# Patient Record
Sex: Female | Born: 1975 | Race: White | Hispanic: No | Smoking: Former smoker
Health system: Southern US, Community
[De-identification: ages and names within clinical notes are randomized; demographics above are authoritative.]

## PROBLEM LIST (undated history)

## (undated) ENCOUNTER — Ambulatory Visit: Payer: 59 | Source: Home / Self Care

## (undated) DIAGNOSIS — R9431 Abnormal electrocardiogram [ECG] [EKG]: Secondary | ICD-10-CM

## (undated) DIAGNOSIS — F419 Anxiety disorder, unspecified: Secondary | ICD-10-CM

## (undated) DIAGNOSIS — D649 Anemia, unspecified: Secondary | ICD-10-CM

## (undated) DIAGNOSIS — T7840XA Allergy, unspecified, initial encounter: Secondary | ICD-10-CM

## (undated) DIAGNOSIS — R0683 Snoring: Secondary | ICD-10-CM

## (undated) DIAGNOSIS — G2581 Restless legs syndrome: Secondary | ICD-10-CM

## (undated) DIAGNOSIS — K219 Gastro-esophageal reflux disease without esophagitis: Secondary | ICD-10-CM

## (undated) DIAGNOSIS — F329 Major depressive disorder, single episode, unspecified: Secondary | ICD-10-CM

## (undated) DIAGNOSIS — F32A Depression, unspecified: Secondary | ICD-10-CM

## (undated) DIAGNOSIS — R51 Headache: Secondary | ICD-10-CM

## (undated) DIAGNOSIS — IMO0002 Reserved for concepts with insufficient information to code with codable children: Secondary | ICD-10-CM

## (undated) DIAGNOSIS — R519 Headache, unspecified: Secondary | ICD-10-CM

## (undated) HISTORY — DX: Depression, unspecified: F32.A

## (undated) HISTORY — DX: Restless legs syndrome: G25.81

## (undated) HISTORY — DX: Abnormal electrocardiogram (ECG) (EKG): R94.31

## (undated) HISTORY — DX: Anxiety disorder, unspecified: F41.9

## (undated) HISTORY — DX: Reserved for concepts with insufficient information to code with codable children: IMO0002

## (undated) HISTORY — DX: Major depressive disorder, single episode, unspecified: F32.9

## (undated) HISTORY — DX: Allergy, unspecified, initial encounter: T78.40XA

---

## 1995-01-18 HISTORY — PX: FOOT SURGERY: SHX648

## 1999-06-29 ENCOUNTER — Inpatient Hospital Stay (HOSPITAL_COMMUNITY): Admission: AD | Admit: 1999-06-29 | Discharge: 1999-06-29 | Payer: Self-pay | Admitting: *Deleted

## 2000-02-25 ENCOUNTER — Other Ambulatory Visit: Admission: RE | Admit: 2000-02-25 | Discharge: 2000-02-25 | Payer: Self-pay | Admitting: Gynecology

## 2001-03-02 ENCOUNTER — Other Ambulatory Visit: Admission: RE | Admit: 2001-03-02 | Discharge: 2001-03-02 | Payer: Self-pay | Admitting: Gynecology

## 2002-03-08 ENCOUNTER — Other Ambulatory Visit: Admission: RE | Admit: 2002-03-08 | Discharge: 2002-03-08 | Payer: Self-pay | Admitting: Gynecology

## 2002-09-13 ENCOUNTER — Inpatient Hospital Stay (HOSPITAL_COMMUNITY): Admission: AD | Admit: 2002-09-13 | Discharge: 2002-09-13 | Payer: Self-pay | Admitting: Gynecology

## 2002-09-24 ENCOUNTER — Other Ambulatory Visit: Admission: RE | Admit: 2002-09-24 | Discharge: 2002-09-24 | Payer: Self-pay | Admitting: Gynecology

## 2003-04-10 ENCOUNTER — Inpatient Hospital Stay (HOSPITAL_COMMUNITY): Admission: AD | Admit: 2003-04-10 | Discharge: 2003-04-12 | Payer: Self-pay | Admitting: Gynecology

## 2003-06-04 ENCOUNTER — Other Ambulatory Visit: Admission: RE | Admit: 2003-06-04 | Discharge: 2003-06-04 | Payer: Self-pay | Admitting: Gynecology

## 2004-01-28 ENCOUNTER — Ambulatory Visit: Payer: Self-pay | Admitting: Family Medicine

## 2004-02-27 ENCOUNTER — Ambulatory Visit: Payer: Self-pay | Admitting: Family Medicine

## 2004-08-27 ENCOUNTER — Other Ambulatory Visit: Admission: RE | Admit: 2004-08-27 | Discharge: 2004-08-27 | Payer: Self-pay | Admitting: Gynecology

## 2004-12-20 ENCOUNTER — Encounter: Admission: RE | Admit: 2004-12-20 | Discharge: 2004-12-20 | Payer: Self-pay | Admitting: Gynecology

## 2005-03-28 ENCOUNTER — Ambulatory Visit: Payer: Self-pay | Admitting: Family Medicine

## 2005-04-20 ENCOUNTER — Ambulatory Visit: Payer: Self-pay | Admitting: Family Medicine

## 2005-05-27 ENCOUNTER — Ambulatory Visit: Payer: Self-pay | Admitting: Family Medicine

## 2005-06-10 ENCOUNTER — Ambulatory Visit: Payer: Self-pay | Admitting: Family Medicine

## 2005-07-01 ENCOUNTER — Ambulatory Visit: Payer: Self-pay | Admitting: Family Medicine

## 2005-08-02 ENCOUNTER — Ambulatory Visit: Payer: Self-pay | Admitting: Family Medicine

## 2005-11-04 ENCOUNTER — Ambulatory Visit: Payer: Self-pay | Admitting: Family Medicine

## 2005-11-18 ENCOUNTER — Other Ambulatory Visit: Admission: RE | Admit: 2005-11-18 | Discharge: 2005-11-18 | Payer: Self-pay | Admitting: Gynecology

## 2006-04-20 ENCOUNTER — Encounter: Payer: Self-pay | Admitting: Internal Medicine

## 2006-04-20 DIAGNOSIS — E669 Obesity, unspecified: Secondary | ICD-10-CM

## 2006-04-20 DIAGNOSIS — F17200 Nicotine dependence, unspecified, uncomplicated: Secondary | ICD-10-CM | POA: Insufficient documentation

## 2006-06-06 ENCOUNTER — Other Ambulatory Visit: Admission: RE | Admit: 2006-06-06 | Discharge: 2006-06-06 | Payer: Self-pay | Admitting: Gynecology

## 2006-11-28 ENCOUNTER — Other Ambulatory Visit: Admission: RE | Admit: 2006-11-28 | Discharge: 2006-11-28 | Payer: Self-pay | Admitting: Gynecology

## 2007-02-27 ENCOUNTER — Other Ambulatory Visit: Admission: RE | Admit: 2007-02-27 | Discharge: 2007-02-27 | Payer: Self-pay | Admitting: Gynecology

## 2007-09-06 ENCOUNTER — Other Ambulatory Visit: Admission: RE | Admit: 2007-09-06 | Discharge: 2007-09-06 | Payer: Self-pay | Admitting: Gynecology

## 2008-02-28 ENCOUNTER — Ambulatory Visit: Payer: Self-pay | Admitting: Gynecology

## 2008-02-28 ENCOUNTER — Other Ambulatory Visit: Admission: RE | Admit: 2008-02-28 | Discharge: 2008-02-28 | Payer: Self-pay | Admitting: Gynecology

## 2008-02-28 ENCOUNTER — Encounter: Payer: Self-pay | Admitting: Gynecology

## 2008-04-17 HISTORY — PX: LAPAROSCOPIC GASTRIC BANDING: SHX1100

## 2008-05-01 ENCOUNTER — Ambulatory Visit: Payer: Self-pay | Admitting: Gynecology

## 2008-09-08 ENCOUNTER — Ambulatory Visit: Payer: Self-pay | Admitting: Gynecology

## 2008-09-08 ENCOUNTER — Other Ambulatory Visit: Admission: RE | Admit: 2008-09-08 | Discharge: 2008-09-08 | Payer: Self-pay | Admitting: Gynecology

## 2008-09-08 ENCOUNTER — Encounter: Payer: Self-pay | Admitting: Gynecology

## 2008-09-25 ENCOUNTER — Ambulatory Visit: Payer: Self-pay | Admitting: Gynecology

## 2009-03-04 ENCOUNTER — Other Ambulatory Visit: Admission: RE | Admit: 2009-03-04 | Discharge: 2009-03-04 | Payer: Self-pay | Admitting: Gynecology

## 2009-03-04 ENCOUNTER — Ambulatory Visit: Payer: Self-pay | Admitting: Gynecology

## 2009-09-10 ENCOUNTER — Other Ambulatory Visit: Admission: RE | Admit: 2009-09-10 | Discharge: 2009-09-10 | Payer: Self-pay | Admitting: Gynecology

## 2009-09-10 ENCOUNTER — Ambulatory Visit: Payer: Self-pay | Admitting: Gynecology

## 2010-06-04 NOTE — Discharge Summary (Signed)
NAMEREGINA, GANCI                         ACCOUNT NO.:  [id number]   MEDICAL RECORD NO.:  [id number]                   PATIENT TYPE:  INP   LOCATION:  9105                                 FACILITY:  [facility]   PHYSICIAN:  Timothy P. Fontaine, M.D.           DATE OF BIRTH:  [birth year]   DATE OF ADMISSION:  04/10/2003  DATE OF DISCHARGE:  04/12/2003                                 DISCHARGE SUMMARY   DISCHARGE DIAGNOSES:  1. Intrauterine pregnancy at 39 weeks delivered.  2. Status post precipitous vaginal delivery.   HISTORY:  This is a 28-years-of-age female gravida 2 para 0 with an EDC of  April 19, 2003.  Prenatal course had been uncomplicated.   HOSPITAL COURSE:  On April 10, 2003 the patient presented at 39 weeks with  rupture of membranes at 4:30 a.m.  She was noted to be a thin meconium and  secondary to premature rupture of membranes the patient was begun on IV  antibiotics.  Subsequently, on April 11, 2003 the patient had a precipitous  delivery per nursing of a female, Apgars of 8 and 9, weight of 5 pounds 8  ounces.  There was no episiotomy or laceration; there were no complications.  Postpartum the patient remained afebrile, voiding, in stable condition.  She  was discharged to home on April 12, 2003 and given [facility]  postpartum instructions.   ACCESSORY CLINICAL FINDINGS/LABORATORY DATA:  The patient is A positive,  rubella immune.  Hemoglobin on April 11, 2003 was 11.5.   DISPOSITION:  The patient is discharged to home, informed to return to the  office in 6 weeks, if had any problem prior to that time to be seen in the  office, given a prescription for Tylox p.r.n. pain.     Susa Loffler, P.A.                    Timothy P. Fontaine, M.D.    TSG/MEDQ  D:  04/28/2003  T:  04/28/2003  Job:  [id number]

## 2011-01-02 ENCOUNTER — Ambulatory Visit: Payer: BC Managed Care – PPO

## 2011-01-02 DIAGNOSIS — H66009 Acute suppurative otitis media without spontaneous rupture of ear drum, unspecified ear: Secondary | ICD-10-CM

## 2011-01-13 DIAGNOSIS — F329 Major depressive disorder, single episode, unspecified: Secondary | ICD-10-CM | POA: Insufficient documentation

## 2011-01-13 DIAGNOSIS — IMO0001 Reserved for inherently not codable concepts without codable children: Secondary | ICD-10-CM | POA: Insufficient documentation

## 2011-01-13 DIAGNOSIS — F419 Anxiety disorder, unspecified: Secondary | ICD-10-CM | POA: Insufficient documentation

## 2011-01-13 DIAGNOSIS — F32A Depression, unspecified: Secondary | ICD-10-CM | POA: Insufficient documentation

## 2011-01-24 ENCOUNTER — Ambulatory Visit (INDEPENDENT_AMBULATORY_CARE_PROVIDER_SITE_OTHER): Payer: BC Managed Care – PPO | Admitting: Gynecology

## 2011-01-24 ENCOUNTER — Encounter: Payer: Self-pay | Admitting: Gynecology

## 2011-01-24 ENCOUNTER — Other Ambulatory Visit: Payer: Self-pay | Admitting: Gynecology

## 2011-01-24 ENCOUNTER — Other Ambulatory Visit (HOSPITAL_COMMUNITY)
Admission: RE | Admit: 2011-01-24 | Discharge: 2011-01-24 | Disposition: A | Discharge status: Home / Self Care | Payer: BC Managed Care – PPO | Source: Ambulatory Visit | Attending: Gynecology | Admitting: Gynecology

## 2011-01-24 VITALS — BP 120/76 | Ht 62.0 in | Wt 200.0 lb

## 2011-01-24 DIAGNOSIS — N390 Urinary tract infection, site not specified: Secondary | ICD-10-CM

## 2011-01-24 DIAGNOSIS — Z131 Encounter for screening for diabetes mellitus: Secondary | ICD-10-CM

## 2011-01-24 DIAGNOSIS — Z01419 Encounter for gynecological examination (general) (routine) without abnormal findings: Secondary | ICD-10-CM

## 2011-01-24 DIAGNOSIS — Z13220 Encounter for screening for lipoid disorders: Secondary | ICD-10-CM

## 2011-01-24 DIAGNOSIS — Z30431 Encounter for routine checking of intrauterine contraceptive device: Secondary | ICD-10-CM

## 2011-01-24 LAB — LIPID PANEL
LDL Cholesterol: 75 mg/dL (ref 0–99)
Triglycerides: 141 mg/dL (ref <150)
VLDL: 28 mg/dL (ref 0–40)

## 2011-01-24 LAB — URINALYSIS, ROUTINE W REFLEX MICROSCOPIC
Ketones, ur: NEGATIVE mg/dL
Nitrite: NEGATIVE
Specific Gravity, Urine: 1.015 (ref 1.005–1.030)
Urobilinogen, UA: 0.2 mg/dL (ref 0.0–1.0)

## 2011-01-24 LAB — URINALYSIS, MICROSCOPIC ONLY: Crystals: NONE SEEN

## 2011-01-24 NOTE — Patient Instructions (Signed)
Attempt to stop smoking as we discussed. Follow up for Pap smear results. Assuming normal then repeat Pap smear in one year. If abnormal then we'll discuss.

## 2011-01-24 NOTE — Progress Notes (Signed)
Daisy Hoffman March 26, 1975 [id number]        36 y.o.  for annual exam.  Mirena IUD, doing well with regular menses.  Past medical history,surgical history, medications, allergies, family history and social history were all reviewed and documented in the EPIC chart. ROS:  Was performed and pertinent positives and negatives are included in the history.  Exam: chaperone present Filed Vitals:   01/24/11 0911  BP: 120/76   General appearance  Normal Skin grossly normal Head/Neck normal with no cervical or supraclavicular adenopathy thyroid normal Lungs  clear Cardiac RR, without RMG Abdominal  soft, nontender, without masses, organomegaly or hernia Breasts  examined lying and sitting without masses, retractions, discharge or axillary adenopathy. Pelvic  Ext/BUS/vagina  normal   Cervix  normal  Pap done IUD string visualized  Uterus  anteverted, normal size, shape and contour, midline and mobile nontender   Adnexa  Without masses or tenderness    Anus and perineum  normal   Rectovaginal  normal sphincter tone without palpated masses or tenderness.    Assessment/Plan:  36 y.o. female for annual exam.    1. 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 Follow up colposcopy 2008 showed cervicitis but no dysplasia. Her last Pap smear showing low-grade dysplasia was 2010.   her Pap smears January 2011 and August 2011 was negative. Pap smear was done today. If negative will continue annual cytology time several more negatives and then we'll go to less frequent screening interval. 2. IUD. Patient has Mirena IUD placed April 2010. IUD string was visualized and she's doing well with this. She will continue through April 2015 at which point she will have it replaced. 3. Mammography. Patient has never had her baseline mammogram. I reviewed screening recommendations between 35 and 40 and she will decide  when to do this. She has no strong family history and is going to wait closer to 40. SBE monthly was reviewed. 4. Cigarette smoking. I reviewed stop smoking strategies. She has been discussing this with Dr. Everlene Other and will follow up with him in reference to this. 5. Anxiety. She is on Zoloft per Dr. Everlene Other and will continue on this. 6. Health maintenance. Will check baseline CBC, glucose, lipid profile, urinalysis. She'll continue to see Dr. Everlene Other for routine health maintenance in see me in a year assuming that her Pap smear is normal.   Dara Lords MD, 9:41 AM 01/24/2011

## 2011-01-24 NOTE — Progress Notes (Signed)
Addended by: Rushie Goltz on: 01/24/2011 10:15 AM   Modules accepted: Orders

## 2011-01-24 NOTE — Progress Notes (Signed)
Addended by: Dara Lords on: 01/24/2011 10:12 AM   Modules accepted: Orders

## 2011-01-25 ENCOUNTER — Other Ambulatory Visit: Payer: Self-pay

## 2011-01-25 LAB — CBC WITH DIFFERENTIAL/PLATELET
Basophils Absolute: 0 10*3/uL (ref 0.0–0.1)
Eosinophils Absolute: 0.2 10*3/uL (ref 0.0–0.7)
Eosinophils Relative: 2 % (ref 0–5)
HCT: 43.5 % (ref 36.0–46.0)
Lymphocytes Relative: 23 % (ref 12–46)
MCH: 29 pg (ref 26.0–34.0)
MCHC: 32.6 g/dL (ref 30.0–36.0)
MCV: 88.8 fL (ref 78.0–100.0)
Monocytes Absolute: 0.4 10*3/uL (ref 0.1–1.0)
RDW: 13 % (ref 11.5–15.5)
WBC: 8.4 10*3/uL (ref 4.0–10.5)

## 2011-01-25 NOTE — Progress Notes (Signed)
Addended by: Dara Lords on: 01/25/2011 07:40 AM   Modules accepted: Orders

## 2011-01-26 LAB — URINE CULTURE: Colony Count: NO GROWTH

## 2011-09-06 ENCOUNTER — Ambulatory Visit: Payer: BC Managed Care – PPO | Admitting: Internal Medicine

## 2011-09-06 VITALS — BP 112/71 | HR 100 | Temp 98.7°F | Resp 18 | Ht 63.0 in | Wt 212.0 lb

## 2011-09-06 DIAGNOSIS — S139XXA Sprain of joints and ligaments of unspecified parts of neck, initial encounter: Secondary | ICD-10-CM

## 2011-09-06 DIAGNOSIS — S161XXA Strain of muscle, fascia and tendon at neck level, initial encounter: Secondary | ICD-10-CM

## 2011-09-06 DIAGNOSIS — M542 Cervicalgia: Secondary | ICD-10-CM

## 2011-09-06 MED ORDER — HYDROCODONE-ACETAMINOPHEN 5-500 MG PO TABS: 1.0000 | ORAL_TABLET | Freq: Three times a day (TID) | ORAL | Status: AC | PRN

## 2011-09-06 MED ORDER — METHOCARBAMOL 750 MG PO TABS: 750.0000 mg | ORAL_TABLET | Freq: Four times a day (QID) | ORAL | Status: AC

## 2011-09-06 NOTE — Progress Notes (Signed)
  Subjective:    Patient ID: Daisy Hoffman, female    DOB: October 10, 1975, 36 y.o.   MRN: [id number]  HPI Felt a pop in her neck, now painful and in spasm. No NMS loss   Review of Systems     Objective:   Physical Exam Tender left neck muscles Neuro intact       Assessment & Plan:  Neck care manual

## 2012-03-26 ENCOUNTER — Encounter: Payer: BC Managed Care – PPO | Admitting: Gynecology

## 2012-04-09 ENCOUNTER — Ambulatory Visit: Payer: BC Managed Care – PPO | Admitting: Emergency Medicine

## 2012-04-09 ENCOUNTER — Ambulatory Visit: Payer: BC Managed Care – PPO

## 2012-04-09 ENCOUNTER — Telehealth: Payer: Self-pay

## 2012-04-09 VITALS — BP 124/82 | HR 117 | Temp 98.4°F | Resp 17 | Ht 63.0 in | Wt 222.0 lb

## 2012-04-09 DIAGNOSIS — M25579 Pain in unspecified ankle and joints of unspecified foot: Secondary | ICD-10-CM

## 2012-04-09 DIAGNOSIS — M25572 Pain in left ankle and joints of left foot: Secondary | ICD-10-CM

## 2012-04-09 DIAGNOSIS — T148XXA Other injury of unspecified body region, initial encounter: Secondary | ICD-10-CM

## 2012-04-09 DIAGNOSIS — IMO0002 Reserved for concepts with insufficient information to code with codable children: Secondary | ICD-10-CM

## 2012-04-09 MED ORDER — NAPROXEN SODIUM 550 MG PO TABS: 550.0000 mg | ORAL_TABLET | Freq: Two times a day (BID) | ORAL | Status: DC

## 2012-04-09 NOTE — Progress Notes (Addendum)
[facility] [address], [place] [address] [phone]  Date:  04/09/2012   Name:  Daisy Hoffman   DOB:  May 02, 1975   MRN:  [id number]  PCP:  Aura Dials, MD    Chief Complaint: Foot Pain   History of Present Illness:  Daisy Hoffman is a 37 y.o. very pleasant female patient who presents with the following:  Larey Seat off a playground ride and injured her left fore foot medially at the great to MTP joint.  Denies any other injury.  No improvement with over the counter medications or other home remedies.   Patient Active Problem List  Diagnosis  . OBESITY  . TOBACCO ABUSE  . Depression  . Anxiety  . IUD  . LGSIL (low grade squamous intraepithelial dysplasia)    Past Medical History  Diagnosis Date  . Depression   . Anxiety   . IUD     Inserted 05-01-08  . 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 Allergy     Past Surgical History  Procedure Laterality Date  . Foot surgery  1997  . 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 Mirena      Inserted 05-01-08    History  Substance Use Topics  . Smoking status: Former Smoker -- 1.00 packs/day    Types: Cigarettes    Start date: 01/25/2012  . Smokeless tobacco: Not on file  . Alcohol Use: No    Family History  Problem Relation Age of Onset  . Diabetes Father   . Hypertension Father   . Osteoporosis Mother   . Hypertension Mother     Allergies  Allergen Reactions  . Penicillins     Medication list has been reviewed and updated.  Current Outpatient Prescriptions on File Prior to Visit  Medication Sig Dispense Refill  . Cetirizine HCl (ZYRTEC PO) Take by mouth.        . levonorgestrel (MIRENA) 20 MCG/24HR IUD 1 each by Intrauterine route once. INSERTED 04/2008.       . Multiple Vitamins-Iron (MULTIVITAMIN/IRON PO) Take by mouth.         No current  facility-administered medications on file prior to visit.    Review of Systems:  As per HPI, otherwise negative.    Physical Examination: Filed Vitals:   04/09/12 0900  BP: 124/82  Pulse: 117  Temp: 98.4 F (36.9 C)  Resp: 17   Filed Vitals:   04/09/12 0900  Height: 5\' 3"  (1.6 m)  Weight: 222 lb (100.699 kg)   Body mass index is 39.34 kg/(m^2). Ideal Body Weight: Weight in (lb) to have BMI = 25: 140.8   GEN: WDWN, NAD, Non-toxic, Alert & Oriented x 3 HEENT: Atraumatic, Normocephalic.  Ears and Nose: No external deformity. EXTR: No clubbing/cyanosis/edema NEURO: Normal gait.  PSYCH: Normally interactive. Conversant. Not depressed or anxious appearing.  Calm demeanor.  LEFT FOOT:  Tender and guards at great toe MTP joint.  No edema or ecchymosis.  Assessment and Plan: Fracture sesmoid bone left foot Anaprox Boot Follow up in two weeks   Signed,  Phillips Odor, MD   [facility] reading (PRIMARY) by  Dr. Dareen Piano.  Fracture sesamoid bone.

## 2012-04-09 NOTE — Telephone Encounter (Signed)
PT SAW Daisy Hoffman THIS MORNING.  SHE HAS A FRACTURE AND WAS PUT IN A BOOT.  HE DID NOT PUT HER ON LIGHT DUTY, BUT TOLD HER TO BE CAREFUL.  HER WORK WILL NOT LET HER COME BACK WITHOUT A NOTE SPECIFYING THAT SHE CAN WORK AND JUST BE "CAREFUL".  NOTE NEEDS TO BE FAXED TO [phone].  YOU CAN REACH Kyla AT [phone]

## 2012-04-09 NOTE — Progress Notes (Signed)
Fit and train for small camwalker

## 2012-04-09 NOTE — Patient Instructions (Addendum)
Sesamoid Injury  Sesamoid bones are bones that are completely enclosed by a tendon. The most recognizable sesamoid bone is the kneecap (patella). Your body also has sesamoid bones in the hands and feet. Sesamoid bones of the feet are more commonly injured than those of the hand. Sesamoid bones in the feet may be injured because of the force placed on them while standing, walking, running, or jumping. Sesamoid injuries include:   Inflammation of the sesamoid (sesamoiditis).  Fracture.  Stress fracture. The sesamoid bone on the base of the big toe is especially susceptible. SYMPTOMS   Pain with weight bearing on the foot, such as with standing, walking, running, jumping, or dancing.  Pain with trying to lift the big toe.  Tenderness and swelling under the base of the big toe. CAUSES  A sesamoid injury is typically caused by acute trauma or overuse trauma to the foot. This may include jumping and landing on the ball of the foot or jumping or dancing on the balls of the feet. Other causes include:  Interrupted blood supply (avascular necrosis).  Infection. RISK INCREASES WITH:  Sports that require jumping from a great height or repeated jumping or standing on the balls of the feet. These include:  Basketball.  Ballet.  Jogging.  Long-distance running.  Shoes that are too small or have very high heels.  Large or poorly shaped sesamoid bone.  Bunions. PREVENTION  Warm up and stretch properly before activity.  Maintain appropriate conditioning:  Ankle and leg flexibility.  Muscle strength and endurance.  Learn and use proper technique and have a coach correct improper technique.  Wear taping, protective strapping, bracing, or padding.  Wear shoes that are the proper size and ensure correct fit. PROGNOSIS If detected early and treated properly, sesamoid injuries are usually curable within 4 to 6 months.  RELATED COMPLICATIONS   Prolonged healing time if not  appropriately treated or if not given enough time to heal.  Fracture does not heal (nonunion).  Prolonged disability.  Frequent recurrence of symptoms. Appropriately addressing the problem with rehabilitation decreases frequency of recurrence and optimizes healing time.  Arthritis of the joint between the sesamoid and the rest of the big toe.  Complications of surgery, including infection, bleeding, injury to nerves, continued pain, bunion or reverse bunion formation, toe weakness, and toe hyperextension. TREATMENT Treatment initially involves the use of ice and medication to reduce pain and inflammation. It may be recommended for you to modify your activities, so they do not cause an increase in the severity of symptoms. Depending on the severity of the injury, you may be required to use crutches in order to keep weight off of the injury. Padding, bracing, or taping the area may help reduce pain. Casting of the leg and foot, a walking boot, or a stiff-soled shoe (with or without an arch support) may also be helpful. For cases of chronic sesamoid symptoms, the use of physical therapy may be recommended. On occasion, corticosteroid injections are given to reduce inflammation. It is uncommon, but possible, for surgery to be necessary to remove the sesamoid bone. MEDICATION   If pain medication is necessary, nonsteroidal anti-inflammatory medications such as aspirin and ibuprofen or other minor pain relievers such as acetaminophen are often recommended.  Do not take pain medication for 7 days before surgery.  Prescription pain relievers are usually only prescribed after surgery. Use only as directed and only as much as you need.  Corticosteroid injections may be given to reduce inflammation. However, these  injections may only be given a certain number of times. HEAT AND COLD Cold treatment (icing) relieves pain and reduces inflammation. Cold treatment should be applied for 10 to 15 minutes every  2 to 3 hours for inflammation and pain and immediately after any activity that aggravates your symptoms. Use ice packs or massage the area with a piece of ice (ice massage). SEEK MEDICAL CARE IF:   Symptoms get worse or do not improve in 6 weeks despite treatment.  Any signs of infection develop, including fever, headaches, muscular aches and weakness, fatigue, redness, warmth, or increased swelling or pain.  Any of the following occur after surgery:  You experience pain, numbness, or coldness in the foot and ankle.  Blue, gray, or dark color appears in the toenails.  Signs of infection develop, including fever, increased pain, swelling, redness, drainage, or bleeding in the surgical area.  New, unexplained symptoms develop (drugs used in treatment may produce side effects including bleeding, stomach upset, and allergic reactions). Document Released: 01/03/2005 Document Revised: 03/28/2011 Document Reviewed: 04/17/2008 [facility] Patient Information 2013 [place], [place].

## 2012-04-09 NOTE — Telephone Encounter (Signed)
done

## 2012-04-10 ENCOUNTER — Encounter: Payer: Self-pay | Admitting: Emergency Medicine

## 2012-04-10 ENCOUNTER — Telehealth: Payer: Self-pay

## 2012-04-10 NOTE — Telephone Encounter (Signed)
Please write a note that limits her prolonged standing and let patient know her xray was reported NEGATIVE by radiologist.  No fracture

## 2012-04-10 NOTE — Telephone Encounter (Signed)
Updated note faxed. Left message for her to advise this is done and no fx was seen.

## 2012-04-10 NOTE — Telephone Encounter (Signed)
Patient called stating that the note that her job received from [place] wasn't good enough. Her employer needs a note stating that she needs to wear the boot at all times and that its okay for her to be on her feet all day. She would like it faxed to Attn:Mandy [phone]. Feel free to contact pt if you have any further questions.

## 2012-04-10 NOTE — Telephone Encounter (Signed)
pls see message, do you want her standing all day?

## 2012-04-11 ENCOUNTER — Telehealth: Payer: Self-pay

## 2012-04-11 NOTE — Telephone Encounter (Signed)
Patient should provide this, I have sent to her. I can not send anywhere unless patient requests. Left message for patient to advise

## 2012-04-11 NOTE — Telephone Encounter (Signed)
[facility] is looking for the out of work note for this patient  Please fax to [facility]:   At [phone]

## 2012-07-27 ENCOUNTER — Emergency Department (HOSPITAL_COMMUNITY)
Admission: EM | Admit: 2012-07-27 | Discharge: 2012-07-27 | Disposition: A | Discharge status: Home / Self Care | Payer: BC Managed Care – PPO | Source: Home / Self Care

## 2012-07-27 ENCOUNTER — Encounter (HOSPITAL_COMMUNITY): Payer: Self-pay | Admitting: *Deleted

## 2012-07-27 DIAGNOSIS — J069 Acute upper respiratory infection, unspecified: Secondary | ICD-10-CM

## 2012-07-27 DIAGNOSIS — J040 Acute laryngitis: Secondary | ICD-10-CM

## 2012-07-27 MED ORDER — HYDROCOD POLST-CHLORPHEN POLST 10-8 MG/5ML PO LQCR: 5.0000 mL | Freq: Two times a day (BID) | ORAL | Status: DC | PRN

## 2012-07-27 NOTE — ED Provider Notes (Signed)
History    CSN: [id number] Arrival date & time 07/27/12  1828  None    Chief Complaint  Patient presents with  . URI   (Consider location/radiation/quality/duration/timing/severity/associated sxs/prior Treatment) HPI Comments: Patient presents with a 3 day history of sore throat, laryngitis, cough with slight yellow production and overall malaise. No fevers are noted.   Patient is a 37 y.o. female presenting with URI. The history is provided by the patient.  URI  Past Medical History  Diagnosis Date  . Depression   . Anxiety   . IUD     Inserted 05-01-08  . 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 Allergy    Past Surgical History  Procedure Laterality Date  . Foot surgery  1997  . 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 Mirena      Inserted 05-01-08   Family History  Problem Relation Age of Onset  . Diabetes Father   . Hypertension Father   . Osteoporosis Mother   . Hypertension Mother    History  Substance Use Topics  . Smoking status: Former Smoker -- 1.00 packs/day for 22 years    Types: Cigarettes    Start date: 01/25/2012    Quit date: 07/27/2012  . Smokeless tobacco: Not on file  . Alcohol Use: No   OB History   Grav Para Term Preterm Abortions TAB SAB Ect Mult Living   2 1 1  1     1      Review of Systems  All other systems reviewed and are negative.    Allergies  Penicillins  Home Medications   Current Outpatient Rx  Name  Route  Sig  Dispense  Refill  . ALPRAZolam (XANAX) 0.25 MG tablet   Oral   Take 0.25 mg by mouth at bedtime as needed for sleep.         Marland Kitchen buPROPion (WELLBUTRIN XL) 300 MG 24 hr tablet   Oral   Take 300 mg by mouth daily.         . Cetirizine HCl (ZYRTEC PO)   Oral   Take by mouth.           . levonorgestrel (MIRENA) 20 MCG/24HR IUD   Intrauterine   1 each by Intrauterine route once.  INSERTED 04/2008.          . Melatonin 1 MG CAPS   Oral   Take by mouth.         . Multiple Vitamins-Iron (MULTIVITAMIN/IRON PO)   Oral   Take by mouth.           . venlafaxine XR (EFFEXOR-XR) 150 MG 24 hr capsule   Oral   Take 150 mg by mouth daily.         . chlorpheniramine-HYDROcodone (TUSSIONEX PENNKINETIC ER) 10-8 MG/5ML LQCR   Oral   Take 5 mLs by mouth every 12 (twelve) hours as needed.   240 mL   0   . naproxen sodium (ANAPROX DS) 550 MG tablet   Oral   Take 1 tablet (550 mg total) by mouth 2 (two) times daily with a meal.   40 tablet   0    BP 118/72  Pulse 90  Temp(Src) 98.5 F (36.9 C) (Oral)  Resp 16  SpO2 99%  LMP 07/20/2012 Physical Exam  Nursing note and vitals reviewed. Constitutional: She appears well-developed and well-nourished. No distress.  HENT:  Head: Normocephalic.  Right Ear: External ear normal.  Left Ear: External ear normal.  Mouth/Throat: No oropharyngeal exudate.  Slight oropharyngeal erythema,  No exudate is noted  Eyes: Conjunctivae are normal.  Neck: Normal range of motion.  Cardiovascular: Normal rate and regular rhythm.   Pulmonary/Chest: Effort normal and breath sounds normal. No respiratory distress. She has no wheezes. She has no rales.  Lymphadenopathy:    She has no cervical adenopathy.  Skin: Skin is warm and dry. She is not diaphoretic.  Psychiatric: Her behavior is normal.    ED Course  Procedures (including critical care time) Labs Reviewed - No data to display No results found. 1. Upper respiratory infection with cough and congestion   2. Laryngitis     MDM  VIRAL URI / Cough-Treat symptomatically  Azucena Fallen, PA-C 07/27/12 1939

## 2012-07-27 NOTE — ED Notes (Signed)
C/o laryngitis since Tues. 7/8.  C/o sore throat also. Cough onset yesterday.  No chills or fever.  C/o L earache.

## 2012-07-27 NOTE — ED Provider Notes (Signed)
Medical screening examination/treatment/procedure(s) were performed by non-physician practitioner and as supervising physician I was immediately available for consultation/collaboration.  Raynald Blend, MD 07/27/12 1958

## 2012-09-10 DIAGNOSIS — J309 Allergic rhinitis, unspecified: Secondary | ICD-10-CM | POA: Insufficient documentation

## 2012-09-10 DIAGNOSIS — R609 Edema, unspecified: Secondary | ICD-10-CM | POA: Insufficient documentation

## 2012-10-04 ENCOUNTER — Encounter (INDEPENDENT_AMBULATORY_CARE_PROVIDER_SITE_OTHER): Payer: Self-pay

## 2012-10-10 ENCOUNTER — Encounter (HOSPITAL_COMMUNITY): Payer: Self-pay | Admitting: Emergency Medicine

## 2012-10-10 ENCOUNTER — Emergency Department (HOSPITAL_COMMUNITY): Payer: BC Managed Care – PPO

## 2012-10-10 ENCOUNTER — Emergency Department (HOSPITAL_COMMUNITY)
Admission: EM | Admit: 2012-10-10 | Discharge: 2012-10-10 | Disposition: A | Discharge status: Home / Self Care | Payer: BC Managed Care – PPO | Attending: Emergency Medicine | Admitting: Emergency Medicine

## 2012-10-10 DIAGNOSIS — F329 Major depressive disorder, single episode, unspecified: Secondary | ICD-10-CM | POA: Insufficient documentation

## 2012-10-10 DIAGNOSIS — R110 Nausea: Secondary | ICD-10-CM | POA: Insufficient documentation

## 2012-10-10 DIAGNOSIS — Z9884 Bariatric surgery status: Secondary | ICD-10-CM | POA: Insufficient documentation

## 2012-10-10 DIAGNOSIS — Z3202 Encounter for pregnancy test, result negative: Secondary | ICD-10-CM | POA: Insufficient documentation

## 2012-10-10 DIAGNOSIS — F3289 Other specified depressive episodes: Secondary | ICD-10-CM | POA: Insufficient documentation

## 2012-10-10 DIAGNOSIS — IMO0002 Reserved for concepts with insufficient information to code with codable children: Secondary | ICD-10-CM | POA: Insufficient documentation

## 2012-10-10 DIAGNOSIS — R1013 Epigastric pain: Secondary | ICD-10-CM

## 2012-10-10 DIAGNOSIS — Z87891 Personal history of nicotine dependence: Secondary | ICD-10-CM | POA: Insufficient documentation

## 2012-10-10 DIAGNOSIS — Z880 Allergy status to penicillin: Secondary | ICD-10-CM | POA: Insufficient documentation

## 2012-10-10 DIAGNOSIS — Z79899 Other long term (current) drug therapy: Secondary | ICD-10-CM | POA: Insufficient documentation

## 2012-10-10 DIAGNOSIS — Z975 Presence of (intrauterine) contraceptive device: Secondary | ICD-10-CM | POA: Insufficient documentation

## 2012-10-10 DIAGNOSIS — F411 Generalized anxiety disorder: Secondary | ICD-10-CM | POA: Insufficient documentation

## 2012-10-10 LAB — CBC WITH DIFFERENTIAL/PLATELET
Basophils Absolute: 0 10*3/uL (ref 0.0–0.1)
Basophils Relative: 0 % (ref 0–1)
MCHC: 32.9 g/dL (ref 30.0–36.0)
Neutro Abs: 4 10*3/uL (ref 1.7–7.7)
Neutrophils Relative %: 60 % (ref 43–77)
Platelets: 254 10*3/uL (ref 150–400)
RDW: 13.5 % (ref 11.5–15.5)

## 2012-10-10 LAB — PREGNANCY, URINE: Preg Test, Ur: NEGATIVE

## 2012-10-10 LAB — URINALYSIS, ROUTINE W REFLEX MICROSCOPIC
Leukocytes, UA: NEGATIVE
Protein, ur: NEGATIVE mg/dL
Urobilinogen, UA: 0.2 mg/dL (ref 0.0–1.0)

## 2012-10-10 LAB — COMPREHENSIVE METABOLIC PANEL
AST: 18 U/L (ref 0–37)
Albumin: 3.8 g/dL (ref 3.5–5.2)
Chloride: 103 mEq/L (ref 96–112)
Creatinine, Ser: 0.78 mg/dL (ref 0.50–1.10)
Potassium: 3.8 mEq/L (ref 3.5–5.1)
Total Bilirubin: 0.2 mg/dL — ABNORMAL LOW (ref 0.3–1.2)

## 2012-10-10 MED ORDER — IOHEXOL 300 MG/ML  SOLN
100.0000 mL | Freq: Once | INTRAMUSCULAR | Status: AC | PRN
  Administered 2012-10-10: 100 mL via INTRAVENOUS

## 2012-10-10 MED ORDER — HYDROMORPHONE HCL PF 1 MG/ML IJ SOLN
1.0000 mg | Freq: Once | INTRAMUSCULAR | Status: AC
  Administered 2012-10-10: 1 mg via INTRAVENOUS
  Filled 2012-10-10: qty 1

## 2012-10-10 MED ORDER — ONDANSETRON HCL 4 MG/2ML IJ SOLN
4.0000 mg | Freq: Once | INTRAMUSCULAR | Status: AC
  Administered 2012-10-10: 4 mg via INTRAVENOUS
  Filled 2012-10-10: qty 2

## 2012-10-10 MED ORDER — HYDROCODONE-ACETAMINOPHEN 5-325 MG PO TABS: 1.0000 | ORAL_TABLET | ORAL | Status: DC | PRN

## 2012-10-10 MED ORDER — IOHEXOL 300 MG/ML  SOLN
50.0000 mL | Freq: Once | INTRAMUSCULAR | Status: AC | PRN
  Administered 2012-10-10: 50 mL via ORAL

## 2012-10-10 MED ORDER — ONDANSETRON 4 MG PO TBDP: ORAL_TABLET | ORAL | Status: DC

## 2012-10-10 NOTE — ED Provider Notes (Signed)
CSN: [id number]     Arrival date & time 10/10/12  1332 History   First MD Initiated Contact with Patient 10/10/12 1436     Chief Complaint  Patient presents with  . Abdominal Pain   (Consider location/radiation/quality/duration/timing/severity/associated sxs/prior Treatment) HPI Comments: 37 yo female with obesity, gastric lap band 4 yrs ago at [facility] presents with recurrent epigastric pain for a few weeks, worse today, called g surgery and asked to come here to ED.  Pt's original surgeon switched groups and scheduled to see [place] [facility].  Pt feels related to lap band fluid.  Mild nausea, no vomiting.  Pain constant, initially intermittent, non radiating. Similar to previous but more severe.   Patient is a 37 y.o. female presenting with abdominal pain. The history is provided by the patient.  Abdominal Pain Associated symptoms: nausea   Associated symptoms: no chest pain, no chills, no dysuria, no fever, no shortness of breath and no vomiting     Past Medical History  Diagnosis Date  . Depression   . Anxiety   . IUD     Inserted 05-01-08  . 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 Allergy    Past Surgical History  Procedure Laterality Date  . Foot surgery  1997  . 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 Mirena      Inserted 05-01-08   Family History  Problem Relation Age of Onset  . Diabetes Father   . Hypertension Father   . Osteoporosis Mother   . Hypertension Mother    History  Substance Use Topics  . Smoking status: Former Smoker -- 1.00 packs/day for 22 years    Types: Cigarettes    Start date: 01/25/2012    Quit date: 07/27/2012  . Smokeless tobacco: Not on file  . Alcohol Use: No   OB History   Grav Para Term Preterm Abortions TAB SAB Ect Mult Living   2 1 1  1     1      Review of Systems  Constitutional: Negative for fever and  chills.  HENT: Negative for neck pain and neck stiffness.   Eyes: Negative for visual disturbance.  Respiratory: Negative for shortness of breath.   Cardiovascular: Negative for chest pain.  Gastrointestinal: Positive for nausea and abdominal pain. Negative for vomiting and blood in stool.  Genitourinary: Negative for dysuria and flank pain.  Musculoskeletal: Negative for back pain.  Skin: Negative for rash.  Neurological: Negative for light-headedness and headaches.    Allergies  Penicillins  Home Medications   Current Outpatient Rx  Name  Route  Sig  Dispense  Refill  . buPROPion (WELLBUTRIN XL) 300 MG 24 hr tablet   Oral   Take 300 mg by mouth daily.         . calcium carbonate (OS-CAL) 600 MG TABS tablet   Oral   Take 600 mg by mouth daily with breakfast.         . HYDROcodone-acetaminophen (NORCO/VICODIN) 5-325 MG per tablet   Oral   Take 1 tablet by mouth once.         Marland Kitchen ibuprofen (ADVIL,MOTRIN) 200 MG tablet   Oral   Take 800 mg by mouth every 8 (eight) hours as needed for pain.         . Melatonin 5 MG CAPS   Oral   Take 1 capsule by mouth  at bedtime.         . Multiple Vitamin (MULTIVITAMIN WITH MINERALS) TABS tablet   Oral   Take 1 tablet by mouth daily.         Marland Kitchen venlafaxine XR (EFFEXOR-XR) 150 MG 24 hr capsule   Oral   Take 150 mg by mouth daily.         Marland Kitchen ALPRAZolam (XANAX) 0.25 MG tablet   Oral   Take 0.25 mg by mouth 3 (three) times daily as needed for anxiety.          Marland Kitchen levonorgestrel (MIRENA) 20 MCG/24HR IUD   Intrauterine   1 each by Intrauterine route once.           BP 126/73  Pulse 88  Temp(Src) 98.4 F (36.9 C) (Oral)  Resp 16  SpO2 100%  LMP 09/19/2012 Physical Exam  Nursing note and vitals reviewed. Constitutional: She is oriented to person, place, and time. She appears well-developed and well-nourished.  HENT:  Head: Normocephalic and atraumatic.  Eyes: Conjunctivae are normal. Right eye exhibits no  discharge. Left eye exhibits no discharge.  Neck: Normal range of motion. Neck supple. No tracheal deviation present.  Cardiovascular: Normal rate and regular rhythm.   Pulmonary/Chest: Effort normal and breath sounds normal.  Abdominal: Soft. She exhibits no distension. There is tenderness (epigastric, mild fullness). There is no guarding.  Musculoskeletal: She exhibits no edema and no tenderness.  Neurological: She is alert and oriented to person, place, and time.  Skin: Skin is warm. No rash noted.  Psychiatric: She has a normal mood and affect.    ED Course  Procedures (including critical care time) Labs Review Labs Reviewed  COMPREHENSIVE METABOLIC PANEL - Abnormal; Notable for the following:    Alkaline Phosphatase 118 (*)    Total Bilirubin 0.2 (*)    All other components within normal limits  URINALYSIS, ROUTINE W REFLEX MICROSCOPIC  PREGNANCY, URINE  CBC WITH DIFFERENTIAL  LACTIC ACID, PLASMA   Imaging Review Ct Abdomen Pelvis W Contrast  10/10/2012   CLINICAL DATA:  Abdominal discomfort  EXAM: CT ABDOMEN AND PELVIS WITH CONTRAST  TECHNIQUE: Multidetector CT imaging of the abdomen and pelvis was performed using the standard protocol following bolus administration of intravenous contrast. Oral contrast was also administered.  CONTRAST:  OMNIPAQUE IOHEXOL 300 MG/ML  SOLN  COMPARISON:  None.  FINDINGS: Lung bases are clear.  There is a lap band positioned at the level of the gastric cardia.  The lap band reservoir is anterior to the left rectus muscle in the left upper quadrant region.  No focal liver lesions are identified. There is no biliary duct dilatation.  Spleen, pancreas, and adrenals appear normal. Kidneys bilaterally show no mass or hydronephrosis on either side.  In the pelvis, there is an intrauterine device within the uterus. There is no pelvic mass or fluid collection. The appendix appears normal.  There is no bowel obstruction. No free air or portal venous air.   There is no ascites, adenopathy, or abscess in the abdomen or pelvis.  Aorta is non aneurysmal. There are no blastic or lytic bone lesions. There is a small bone island in the mid right iliac crest. A second small bone island dislocated in the right pubic symphysis region.  IMPRESSION: Lap band positioned at the level of the gastric cardia. Intrauterine device within uterus. There is no evidence of mesenteric inflammation or abscess. No bowel obstruction. Appendix appears normal.  There is no mesenteric inflammation or  bowel obstruction.   Electronically Signed   By: Bretta Bang   On: 10/10/2012 16:30    MDM  No diagnosis found. Pain meds, fluids and CT scan to evaluate previous surgery and other causes of abd pain. Updated patient on plan.   CT no acute findings, pain improved. Paged surgery to discuss further.    Discussed and reviewed CT with surgery on call, rec close fup oupt.  DC  Abd pain epig, lap band history  Enid Skeens, MD 10/10/12 [phone]

## 2012-10-10 NOTE — ED Notes (Signed)
Per pt, lap band 4 years ago-abdominal discomfort started a few weeks ago-worse today-was told to come to ED for eval

## 2012-10-11 ENCOUNTER — Ambulatory Visit (INDEPENDENT_AMBULATORY_CARE_PROVIDER_SITE_OTHER): Payer: BC Managed Care – PPO | Admitting: Surgery

## 2012-10-11 ENCOUNTER — Other Ambulatory Visit (INDEPENDENT_AMBULATORY_CARE_PROVIDER_SITE_OTHER): Payer: Self-pay | Admitting: Surgery

## 2012-10-11 ENCOUNTER — Encounter (INDEPENDENT_AMBULATORY_CARE_PROVIDER_SITE_OTHER): Payer: Self-pay | Admitting: Surgery

## 2012-10-11 VITALS — BP 116/72 | HR 72 | Temp 98.1°F | Ht 62.0 in | Wt 228.2 lb

## 2012-10-11 DIAGNOSIS — Z9884 Bariatric surgery status: Secondary | ICD-10-CM

## 2012-10-11 DIAGNOSIS — Z6841 Body Mass Index (BMI) 40.0 and over, adult: Secondary | ICD-10-CM

## 2012-10-11 NOTE — Progress Notes (Signed)
Chief Complaint:  Lapband in [facility] April 2010  History of Present Illness:  Daisy Hoffman is an 37 y.o. female who had a Lapband AP by Dr. Adolphus Birchwood  On 04/22/2008.  She had good weight loss until she was overfilled and after her unfillling she regained weight.  She presented with a 2 week history of increasing tightness of her band and fullness  Past Medical History  Diagnosis Date  . Depression   . Anxiety   . IUD     Inserted 05-01-08  . 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 Allergy     Past Surgical History  Procedure Laterality Date  . Foot surgery  1997  . 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 Mirena      Inserted 05-01-08    Current Outpatient Prescriptions  Medication Sig Dispense Refill  . ALPRAZolam (XANAX) 0.25 MG tablet Take 0.25 mg by mouth 3 (three) times daily as needed for anxiety.       Marland Kitchen buPROPion (WELLBUTRIN XL) 300 MG 24 hr tablet Take 300 mg by mouth daily.      . calcium carbonate (OS-CAL) 600 MG TABS tablet Take 600 mg by mouth daily with breakfast.      . HYDROcodone-acetaminophen (NORCO) 5-325 MG per tablet Take 1-2 tablets by mouth every 4 (four) hours as needed for pain.  10 tablet  0  . HYDROcodone-acetaminophen (NORCO/VICODIN) 5-325 MG per tablet Take 1 tablet by mouth once.      Marland Kitchen ibuprofen (ADVIL,MOTRIN) 200 MG tablet Take 800 mg by mouth every 8 (eight) hours as needed for pain.      Marland Kitchen levonorgestrel (MIRENA) 20 MCG/24HR IUD 1 each by Intrauterine route once.       . Melatonin 5 MG CAPS Take 1 capsule by mouth at bedtime.      . Multiple Vitamin (MULTIVITAMIN WITH MINERALS) TABS tablet Take 1 tablet by mouth daily.      . ondansetron (ZOFRAN ODT) 4 MG disintegrating tablet 4mg  ODT q4 hours prn nausea/vomit  10 tablet  0  . venlafaxine XR (EFFEXOR-XR) 150 MG 24 hr capsule Take 150 mg by mouth daily.       No current  facility-administered medications for this visit.   Penicillins Family History  Problem Relation Age of Onset  . Diabetes Father   . Hypertension Father   . Osteoporosis Mother   . Hypertension Mother    Social History:   reports that she quit smoking about 2 months ago. Her smoking use included Cigarettes. She started smoking about 8 months ago. She has a 22 pack-year smoking history. She does not have any smokeless tobacco history on file. She reports that she does not drink alcohol or use illicit drugs.   REVIEW OF SYSTEMS - PERTINENT POSITIVES ONLY: See accompanying papers from [facility]  Physical Exam:   Blood pressure 116/72, pulse 72, temperature 98.1 F (36.7 C), temperature source Temporal, height 5\' 2"  (1.575 m), weight 228 lb 3.2 oz (103.511 kg), last menstrual period 09/19/2012. Body mass index is 41.73 kg/(m^2).  Gen:  WDWN WF NAD  Neurological: Alert and oriented to person, place, and time. Motor and sensory function is grossly intact  Head: Normocephalic and atraumatic.  Eyes: Conjunctivae are normal. Pupils are equal, round, and reactive to light. No scleral icterus.  Neck: Normal range of motion. Neck supple. No tracheal deviation or thyromegaly  present.  Cardiovascular:  SR without murmurs or gallops.  No carotid bruits Respiratory: Effort normal.  No respiratory distress. No chest wall tenderness. Breath sounds normal.  No wheezes, rales or rhonchi.  Abdomen:  Port is in the left upper quadrant.  It was accessed and 2 cc were found in the band.  1 cc was withdrawn.   GU: Musculoskeletal: Normal range of motion. Extremities are nontender. No cyanosis, edema or clubbing noted Lymphadenopathy: No cervical, preauricular, postauricular or axillary adenopathy is present Skin: Skin is warm and dry. No rash noted. No diaphoresis. No erythema. No pallor. Pscyh: Normal mood and affect. Behavior is normal. Judgment and thought content normal.   LABORATORY RESULTS: Results  for orders placed during the hospital encounter of 10/10/12 (from the past 48 hour(s))  URINALYSIS, ROUTINE W REFLEX MICROSCOPIC     Status: None   Collection Time    10/10/12  2:41 PM      Result Value Range   Color, Urine YELLOW  YELLOW   APPearance CLEAR  CLEAR   Specific Gravity, Urine 1.013  1.005 - 1.030   pH 6.5  5.0 - 8.0   Glucose, UA NEGATIVE  NEGATIVE mg/dL   Hgb urine dipstick NEGATIVE  NEGATIVE   Bilirubin Urine NEGATIVE  NEGATIVE   Ketones, ur NEGATIVE  NEGATIVE mg/dL   Protein, ur NEGATIVE  NEGATIVE mg/dL   Urobilinogen, UA 0.2  0.0 - 1.0 mg/dL   Nitrite NEGATIVE  NEGATIVE   Leukocytes, UA NEGATIVE  NEGATIVE   Comment: MICROSCOPIC NOT DONE ON URINES WITH NEGATIVE PROTEIN, BLOOD, LEUKOCYTES, NITRITE, OR GLUCOSE <1000 mg/dL.  PREGNANCY, URINE     Status: None   Collection Time    10/10/12  2:41 PM      Result Value Range   Preg Test, Ur NEGATIVE  NEGATIVE   Comment:            THE SENSITIVITY OF THIS     METHODOLOGY IS >20 mIU/mL.  CBC WITH DIFFERENTIAL     Status: None   Collection Time    10/10/12  3:30 PM      Result Value Range   WBC 6.8  4.0 - 10.5 K/uL   RBC 5.04  3.87 - 5.11 MIL/uL   Hemoglobin 13.6  12.0 - 15.0 g/dL   HCT 16.1  09.6 - 04.5 %   MCV 82.1  78.0 - 100.0 fL   MCH 27.0  26.0 - 34.0 pg   MCHC 32.9  30.0 - 36.0 g/dL   RDW 40.9  81.1 - 91.4 %   Platelets 254  150 - 400 K/uL   Neutrophils Relative % 60  43 - 77 %   Neutro Abs 4.0  1.7 - 7.7 K/uL   Lymphocytes Relative 33  12 - 46 %   Lymphs Abs 2.2  0.7 - 4.0 K/uL   Monocytes Relative 6  3 - 12 %   Monocytes Absolute 0.4  0.1 - 1.0 K/uL   Eosinophils Relative 1  0 - 5 %   Eosinophils Absolute 0.1  0.0 - 0.7 K/uL   Basophils Relative 0  0 - 1 %   Basophils Absolute 0.0  0.0 - 0.1 K/uL  COMPREHENSIVE METABOLIC PANEL     Status: Abnormal   Collection Time    10/10/12  3:30 PM      Result Value Range   Sodium 139  135 - 145 mEq/L   Potassium 3.8  3.5 - 5.1 mEq/L   Chloride  103  96 - 112  mEq/L   CO2 25  19 - 32 mEq/L   Glucose, Bld 70  70 - 99 mg/dL   BUN 7  6 - 23 mg/dL   Creatinine, Ser 4.54  0.50 - 1.10 mg/dL   Calcium 9.1  8.4 - 09.8 mg/dL   Total Protein 6.8  6.0 - 8.3 g/dL   Albumin 3.8  3.5 - 5.2 g/dL   AST 18  0 - 37 U/L   ALT 15  0 - 35 U/L   Alkaline Phosphatase 118 (*) 39 - 117 U/L   Total Bilirubin 0.2 (*) 0.3 - 1.2 mg/dL   GFR calc non Af Amer >90  >90 mL/min   GFR calc Af Amer >90  >90 mL/min   Comment: (NOTE)     The eGFR has been calculated using the CKD EPI equation.     This calculation has not been validated in all clinical situations.     eGFR's persistently <90 mL/min signify possible Chronic Kidney     Disease.  LACTIC ACID, PLASMA     Status: None   Collection Time    10/10/12  3:30 PM      Result Value Range   Lactic Acid, Venous 1.5  0.5 - 2.2 mmol/L    RADIOLOGY RESULTS: Ct Abdomen Pelvis W Contrast  10/10/2012   CLINICAL DATA:  Abdominal discomfort  EXAM: CT ABDOMEN AND PELVIS WITH CONTRAST  TECHNIQUE: Multidetector CT imaging of the abdomen and pelvis was performed using the standard protocol following bolus administration of intravenous contrast. Oral contrast was also administered.  CONTRAST:  OMNIPAQUE IOHEXOL 300 MG/ML  SOLN  COMPARISON:  None.  FINDINGS: Lung bases are clear.  There is a lap band positioned at the level of the gastric cardia.  The lap band reservoir is anterior to the left rectus muscle in the left upper quadrant region.  No focal liver lesions are identified. There is no biliary duct dilatation.  Spleen, pancreas, and adrenals appear normal. Kidneys bilaterally show no mass or hydronephrosis on either side.  In the pelvis, there is an intrauterine device within the uterus. There is no pelvic mass or fluid collection. The appendix appears normal.  There is no bowel obstruction. No free air or portal venous air.  There is no ascites, adenopathy, or abscess in the abdomen or pelvis.  Aorta is non aneurysmal. There are  no blastic or lytic bone lesions. There is a small bone island in the mid right iliac crest. A second small bone island dislocated in the right pubic symphysis region.  IMPRESSION: Lap band positioned at the level of the gastric cardia. Intrauterine device within uterus. There is no evidence of mesenteric inflammation or abscess. No bowel obstruction. Appendix appears normal.  There is no mesenteric inflammation or bowel obstruction.   Electronically Signed   By: Bretta Bang   On: 10/10/2012 16:30    Problem List: Patient Active Problem List   Diagnosis Date Noted  . Depression   . Anxiety   . IUD   . LGSIL (low grade squamous intraepithelial dysplasia)   . OBESITY 04/20/2006  . TOBACCO ABUSE 04/20/2006    Assessment & Plan: Lapband AP placed in [facility].  1 cc at least remains in band.  Pt able to drink.  Will get referral to [facility] for dietary management    Matt B. Daphine Deutscher, MD, [facility] [phone] beeper [phone]  10/11/2012 10:17 AM

## 2012-10-11 NOTE — Patient Instructions (Signed)

## 2012-10-18 ENCOUNTER — Ambulatory Visit (INDEPENDENT_AMBULATORY_CARE_PROVIDER_SITE_OTHER): Payer: Self-pay | Admitting: Surgery

## 2012-10-30 ENCOUNTER — Ambulatory Visit: Payer: BC Managed Care – PPO | Admitting: *Deleted

## 2012-11-21 ENCOUNTER — Telehealth: Payer: Self-pay

## 2012-11-21 NOTE — Telephone Encounter (Signed)
Request given to xray 

## 2012-11-21 NOTE — Telephone Encounter (Signed)
PT HAVE ANOTHER APPT IN THE MIDDLE OF November AND NEED TO COME BY AND PICK UP HER XRAYS. PLEASE CALL [phone]

## 2012-11-23 ENCOUNTER — Telehealth: Payer: Self-pay

## 2012-11-23 NOTE — Telephone Encounter (Signed)
Patient is calling us back to let us know that the xray that she needs is of her foot

## 2012-11-23 NOTE — Telephone Encounter (Signed)
Noted, have given to xray

## 2012-12-03 ENCOUNTER — Ambulatory Visit: Payer: BC Managed Care – PPO | Admitting: *Deleted

## 2012-12-10 ENCOUNTER — Ambulatory Visit: Payer: BC Managed Care – PPO | Admitting: *Deleted

## 2012-12-26 ENCOUNTER — Ambulatory Visit: Payer: BC Managed Care – PPO | Admitting: Dietician

## 2013-01-18 ENCOUNTER — Encounter: Payer: Self-pay | Admitting: Dietician

## 2013-01-18 ENCOUNTER — Encounter: Payer: BC Managed Care – PPO | Attending: Surgery | Admitting: Dietician

## 2013-01-18 VITALS — Ht 62.0 in | Wt 228.8 lb

## 2013-01-18 DIAGNOSIS — E669 Obesity, unspecified: Secondary | ICD-10-CM | POA: Insufficient documentation

## 2013-01-18 DIAGNOSIS — Z713 Dietary counseling and surveillance: Secondary | ICD-10-CM | POA: Insufficient documentation

## 2013-01-18 NOTE — Progress Notes (Signed)
  Medical Nutrition Therapy:  Appt start time: 1300 end time:  1400.  Assessment:  Primary concerns today: Daisy Hoffman is here today since she had a lap band placed at [facility] in 2010. Band was "overfilled",  then all of the fluid was removed 1.5 years ago, and then 2 ccs were put back in. Stated that she had a lot of pain during that time. Transferred to Dr. Daphine DeutscherMartin on 10/11/12 who planned to start over again with adjusting the band. Currently has about 1 cc in band. Stated that she received little to no nutrition education when she first got her lap band surgery.  Still will occasionally have pain if she "eats too much". Currently eating softer foods and protein shakes to avoid overeating.   Would like to lose 80 lbs at this point. Total weight loss was about 40 lbs after surgery.    Preferred Learning Style:  No preference indicated   Learning Readiness:   Ready  24-hr recall: B (AM): drinking carnation instant breakfast with skim milk Snk (AM): almonds with dried cranberries or [employer] bar  L (PM): [employer] or Healthy Choice or out to lunch cheeseburger or salad (heaviest meal of the day) Snk ( PM): none  D  (PM): chicken nuggets or stew or chicken in crockpot Snk (PM): none  Fluid intake: 1-1.5 "water bottles" through the day (about 20-25 oz) + 8 oz skim milk, 8 oz sweet tea with lunch  Estimated total protein intake: ~50-60 g   Medications: see list Supplementation: MVI and calcium 2-3 x week   Using straws: Using Drinking while eating: Yes Hair loss: Yes in the past 30 days Carbonated beverages: No  N/V/D/C: Pain around port site, having constipation adding stool softener every night which is helping  Last Lap-Band fill: currently has about 1 cc in, will have fill in about 3 months  Recent physical activity:  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  Handouts given during visit include:  Bariatric  Surgery Specialized Post Op Diet  Supplements given during visit include:  2 [employer] Chocolate, lot # [id number], exp 01/2014   Nutritional Diagnosis:  Daisy Hoffman-3.3 Overweight/obesity related to past poor dietary habits and physical inactivity as evidenced by patient w/hx of lap band surgery following dietary guidelines for continued weight loss.    Intervention:  Nutrition education provided.  Teaching Method Utilized:  Visual Auditory Hands on  Barriers to learning/adherence to lifestyle change: none  Demonstrated degree of understanding via:  Teach Back   Monitoring/Evaluation:  Dietary intake, exercise, lap band fills, and body weight. Follow up in 4 months

## 2013-01-18 NOTE — Patient Instructions (Signed)
Goals:  Follow Bariatric Surgery Specialized Post-Op Diet (ideas)  Aim for maximum of 15 grams of carbs per meal/10-15 grams per snack  Avoid white starches and starchy veggies (potatoes, peas, corn, etc)  Avoid sweetened drinks - keep working on limiting sugar in sweet tea  Eat 3-6 small meals/snacks, every 3-5 hrs  Increase lean protein foods to meet 60-80g goal  Always have a protein source with carbs  Increase fluid intake to 64oz +  Aim for >30 min of physical activity daily   Continue daily vitamin supplementation   *(1) complete multivitamin with iron and 2-3 doses of 500 mg calcium citrate  * Make sure to separate the multivitamin and calcium by 2 hours to prevent iron/calcium from binding together and leaving your body  * Make sure to take your calcium in 3 separate doses because your body cannot absorb more than 500 mg at a time

## 2013-03-29 ENCOUNTER — Ambulatory Visit: Payer: BC Managed Care – PPO | Admitting: Family Medicine

## 2013-03-29 VITALS — BP 122/76 | HR 98 | Temp 99.0°F | Resp 17 | Ht 63.5 in | Wt 229.0 lb

## 2013-03-29 DIAGNOSIS — J020 Streptococcal pharyngitis: Secondary | ICD-10-CM

## 2013-03-29 DIAGNOSIS — J029 Acute pharyngitis, unspecified: Secondary | ICD-10-CM

## 2013-03-29 LAB — POCT RAPID STREP A (OFFICE): Rapid Strep A Screen: POSITIVE — AB

## 2013-03-29 MED ORDER — AZITHROMYCIN 250 MG PO TABS: ORAL_TABLET | ORAL | Status: DC

## 2013-03-29 NOTE — Patient Instructions (Signed)
Strep Throat  Strep throat is an infection of the throat caused by a bacteria named Streptococcus pyogenes. Your caregiver may call the infection streptococcal "tonsillitis" or "pharyngitis" depending on whether there are signs of inflammation in the tonsils or back of the throat. Strep throat is most common in children aged 38 15 years during the cold months of the year, but it can occur in people of any age during any season. This infection is spread from person to person (contagious) through coughing, sneezing, or other close contact.  SYMPTOMS   · Fever or chills.  · Painful, swollen, red tonsils or throat.  · Pain or difficulty when swallowing.  · White or yellow spots on the tonsils or throat.  · Swollen, tender lymph nodes or "glands" of the neck or under the jaw.  · Red rash all over the body (rare).  DIAGNOSIS   Many different infections can cause the same symptoms. A test must be done to confirm the diagnosis so the right treatment can be given. A "rapid strep test" can help your caregiver make the diagnosis in a few minutes. If this test is not available, a light swab of the infected area can be used for a throat culture test. If a throat culture test is done, results are usually available in a day or two.  TREATMENT   Strep throat is treated with antibiotic medicine.  HOME CARE INSTRUCTIONS   · Gargle with 1 tsp of salt in 1 cup of warm water, 3 4 times per day or as needed for comfort.  · Family members who also have a sore throat or fever should be tested for strep throat and treated with antibiotics if they have the strep infection.  · Make sure everyone in your household washes their hands well.  · Do not share food, drinking cups, or personal items that could cause the infection to spread to others.  · You may need to eat a soft food diet until your sore throat gets better.  · Drink enough water and fluids to keep your urine clear or pale yellow. This will help prevent dehydration.  · Get plenty of  rest.  · Stay home from school, daycare, or work until you have been on antibiotics for 24 hours.  · Only take over-the-counter or prescription medicines for pain, discomfort, or fever as directed by your caregiver.  · If antibiotics are prescribed, take them as directed. Finish them even if you start to feel better.  SEEK MEDICAL CARE IF:   · The glands in your neck continue to enlarge.  · You develop a rash, cough, or earache.  · You cough up green, yellow-brown, or bloody sputum.  · You have pain or discomfort not controlled by medicines.  · Your problems seem to be getting worse rather than better.  SEEK IMMEDIATE MEDICAL CARE IF:   · You develop any new symptoms such as vomiting, severe headache, stiff or painful neck, chest pain, shortness of breath, or trouble swallowing.  · You develop severe throat pain, drooling, or changes in your voice.  · You develop swelling of the neck, or the skin on the neck becomes red and tender.  · You have a fever.  · You develop signs of dehydration, such as fatigue, dry mouth, and decreased urination.  · You become increasingly sleepy, or you cannot wake up completely.  Document Released: 01/01/2000 Document Revised: 12/21/2011 Document Reviewed: 03/04/2010  Exit[employer]® Patient Information ©2014 [employer].

## 2013-03-29 NOTE — Progress Notes (Signed)
Subjective: Patient has had a sore throat for about 5 days. No well defined fever, maybe 99. Her sister had strep last week. She hurts both of her ears.  Objective: TMs are normal. Throat erythematous without exudate. Cultures taken. Neck supple with small nodes. Chest clear. Heart regular without murmurs.  Assessment: Pharyngitis   Results for orders placed in visit on 03/29/13  POCT RAPID STREP A (OFFICE)      Result Value Ref Range   Rapid Strep A Screen Positive (*) Negative

## 2013-03-31 LAB — CULTURE, GROUP A STREP

## 2013-04-04 ENCOUNTER — Telehealth: Payer: Self-pay

## 2013-04-04 NOTE — Telephone Encounter (Signed)
Pt called and stated she was in on 03/29/13 and Dxd w/strep. She completed her Z-pak and was feeling better, but today she is having ear pain again as bad as when she came in. This was her main complaint when she came for OV. Please advise if she needs longer round of Abxs or other instr's.

## 2013-04-05 NOTE — Telephone Encounter (Signed)
Lm for rtn call to have pt RTC

## 2013-04-05 NOTE — Telephone Encounter (Signed)
Ear needs to be rechecked.

## 2013-04-12 ENCOUNTER — Encounter: Payer: BC Managed Care – PPO | Admitting: Gynecology

## 2013-05-03 ENCOUNTER — Other Ambulatory Visit (HOSPITAL_COMMUNITY)
Admission: RE | Admit: 2013-05-03 | Discharge: 2013-05-03 | Disposition: A | Discharge status: Home / Self Care | Payer: BC Managed Care – PPO | Source: Ambulatory Visit | Attending: Gynecology | Admitting: Gynecology

## 2013-05-03 ENCOUNTER — Ambulatory Visit (INDEPENDENT_AMBULATORY_CARE_PROVIDER_SITE_OTHER): Payer: BC Managed Care – PPO | Admitting: Gynecology

## 2013-05-03 ENCOUNTER — Encounter: Payer: Self-pay | Admitting: Gynecology

## 2013-05-03 VITALS — BP 120/76 | Ht 62.0 in | Wt 226.0 lb

## 2013-05-03 DIAGNOSIS — Z30432 Encounter for removal of intrauterine contraceptive device: Secondary | ICD-10-CM

## 2013-05-03 DIAGNOSIS — Z01419 Encounter for gynecological examination (general) (routine) without abnormal findings: Secondary | ICD-10-CM | POA: Insufficient documentation

## 2013-05-03 DIAGNOSIS — Z309 Encounter for contraceptive management, unspecified: Secondary | ICD-10-CM

## 2013-05-03 MED ORDER — NORETHINDRONE ACET-ETHINYL EST 1-20 MG-MCG PO TABS: 1.0000 | ORAL_TABLET | Freq: Every day | ORAL | Status: DC

## 2013-05-03 NOTE — Patient Instructions (Signed)
Start the birth control pills as we discussed. Call if you any issues with these. Followup in one year for annual exam.  Oral Contraception Use Oral contraceptive pills (OCPs) are medicines taken to prevent pregnancy. OCPs work by preventing the ovaries from releasing eggs. The hormones in OCPs also cause the cervical mucus to thicken, preventing the sperm from entering the uterus. The hormones also cause the uterine lining to become thin, not allowing a fertilized egg to attach to the inside of the uterus. OCPs are highly effective when taken exactly as prescribed. However, OCPs do not prevent sexually transmitted diseases (STDs). Safe sex practices, such as using condoms along with an OCP, can help prevent STDs. Before taking OCPs, you may have a physical exam and Pap test. Your health care provider may also order blood tests if necessary. Your health care provider will make sure you are a good candidate for oral contraception. Discuss with your health care provider the possible side effects of the OCP you may be prescribed. When starting an OCP, it can take 2 to 3 months for the body to adjust to the changes in hormone levels in your body.  HOW TO TAKE ORAL CONTRACEPTIVE PILLS Your health care provider may advise you on how to start taking the first cycle of OCPs. Otherwise, you can:   Start on day 1 of your menstrual period. You will not need any backup contraceptive protection with this start time.   Start on the first Sunday after your menstrual period or the day you get your prescription. In these cases, you will need to use backup contraceptive protection for the first week.   Start the pill at any time of your cycle. If you take the pill within 5 days of the start of your period, you are protected against pregnancy right away. In this case, you will not need a backup form of birth control. If you start at any other time of your menstrual cycle, you will need to use another form of birth  control for 7 days. If your OCP is the type called a minipill, it will protect you from pregnancy after taking it for 2 days (48 hours). After you have started taking OCPs:   If you forget to take 1 pill, take it as soon as you remember. Take the next pill at the regular time.   If you miss 2 or more pills, call your health care provider because different pills have different instructions for missed doses. Use backup birth control until your next menstrual period starts.   If you use a 28-day pack that contains inactive pills and you miss 1 of the last 7 pills (pills with no hormones), it will not matter. Throw away the rest of the nonhormone pills and start a new pill pack.  No matter which day you start the OCP, you will always start a new pack on that same day of the week. Have an extra pack of OCPs and a backup contraceptive method available in case you miss some pills or lose your OCP pack.  HOME CARE INSTRUCTIONS   Do not smoke.   Always use a condom to protect against STDs. OCPs do not protect against STDs.   Use a calendar to mark your menstrual period days.   Read the information and directions that came with your OCP. Talk to your health care provider if you have questions.  SEEK MEDICAL CARE IF:   You develop nausea and vomiting.   You have  abnormal vaginal discharge or bleeding.   You develop a rash.   You miss your menstrual period.   You are losing your hair.   You need treatment for mood swings or depression.   You get dizzy when taking the OCP.   You develop acne from taking the OCP.   You become pregnant.  SEEK IMMEDIATE MEDICAL CARE IF:   You develop chest pain.   You develop shortness of breath.   You have an uncontrolled or severe headache.   You develop numbness or slurred speech.   You develop visual problems.   You develop pain, redness, and swelling in the legs.  Document Released: 12/23/2010 Document Revised: 09/05/2012  Document Reviewed: 06/24/2012 [facility] Patient Information 2014 [place], [place].

## 2013-05-03 NOTE — Progress Notes (Signed)
Daisy Hoffman 01/21/1975 [id number]        38 y.o.  G2P1011 for annual exam and to remove her IUD which is overdue to be removed.  Past medical history,surgical history, problem list, medications, allergies, family history and social history were all reviewed and documented as reviewed in the EPIC chart.  ROS:  12 system ROS performed with pertinent positives and negatives included in the history, assessment and plan.  Included Systems: General, HEENT, Neck, Cardiovascular, Pulmonary, Gastrointestinal, Genitourinary, Musculoskeletal, Dermatologic, Endocrine, Hematological, Neurologic, Psychiatric Additional significant findings : None   Exam: Daisy Hoffman assistant Filed Vitals:   05/03/13 1358  BP: 120/76  Height: 5\' 2"  (1.575 m)  Weight: 226 lb (102.513 kg)   General appearance:  Normal affect, orientation and appearance. Skin: Grossly normal HEENT: Normal without gross oral lesions, cervical or supraclavicular adenopathy. Thyroid normal.  Lungs:  Clear without wheezing, rales or rhonchi Cardiac: RR, without RMG Abdominal:  Soft, nontender, without masses, guarding, rebound, organomegaly or hernia Breasts:  Examined lying and sitting without masses, retractions, discharge or axillary adenopathy. Pelvic:  Ext/BUS/vagina normal with light menses flow  Cervix normal with light menses flow. IUD string not visible. Pap done. Subsequently the IUD string was grasped within the endocervical canal with a [place] forcep and her Mirena IUD was removed, shown to her and discarded.  Uterus anteverted, normal size, shape and contour, midline and mobile nontender   Adnexa  Without masses or tenderness    Anus and perineum  Normal   Rectovaginal  Normal sphincter tone without palpated masses or tenderness.    Assessment/Plan:  38 y.o. 372P1011 female for annual exam with regular menses, not currently sexually active.   1. Contraception. Patient had Mirena IUD overdue to be removed and was removed as  above. Discussed contraceptive options to include pill patch ring Depo-Provera nexplanon IUD sterilization. The pros/cons, risks/benefits of each choice discussed. She has smoked in the past but stopped 14 months ago. It is documented in the chart her stop date was 07/2012 but the patient states it has been over a year. Increased risk of blood clots stroke heart attack DVT associated with cigarette smoking and birth control pills in the later 30s. Unable to say if and when it is safe to use pills again after stopping smoking. After lengthy discussion patient does want to try low-dose oral contraceptives and she clearly understands the issues and the risks and agrees not to start smoking again. Loestrin 120 equivalence prescribed x1 year. Extended use, offbrand labeling also reviewed. 2. Pap smear done today. 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 Last several Pap smears normal. Discussed extending interval to 3 years if this Pap smear is normal. We'll rediscuss on an annual basis. 3. Mammography. Screening mammography between 35 and 40 recommendations reviewed. Patient has no family history prefers to wait closer to 40. SBE monthly reviewed. 4. Health maintenance. No lab work done as patient reports this all done through her primary physician's office. Followup one year, sooner as needed.   Note: This document was prepared with digital dictation and possible smart phrase technology. Any transcriptional errors that result from this process are unintentional.   Daisy Lordsimothy P Reynald Woods MD, 2:52 PM 05/03/2013

## 2013-05-13 ENCOUNTER — Ambulatory Visit: Payer: BC Managed Care – PPO | Admitting: Dietician

## 2013-07-12 DIAGNOSIS — R5383 Other fatigue: Secondary | ICD-10-CM | POA: Insufficient documentation

## 2013-09-27 ENCOUNTER — Telehealth: Payer: Self-pay

## 2013-09-27 ENCOUNTER — Telehealth: Payer: Self-pay | Admitting: Gynecology

## 2013-09-27 NOTE — Telephone Encounter (Signed)
09/27/13-I LM VM for patient that since she doesn't have any insurance her cost for the Mirena & insertion would be $1505.00. $1395.00 for the Mirena and $110.00(50% disc off reg cost of $220) for the insertion charge. She will call us if she wants to proceed. Payable at insertion. wl

## 2013-09-27 NOTE — Telephone Encounter (Signed)
Patient had Mirena IUD removed back in April and has given the Loestrin a try. She said she does not like the OC and wants to back on Mirena. Ok?

## 2013-09-27 NOTE — Telephone Encounter (Signed)
okay

## 2013-09-27 NOTE — Telephone Encounter (Signed)
Left detailed message that MIrena ok with Dr. Velvet Bathe.  Toniann Fail will check ins benefits and she should hear from her next week some time.

## 2013-10-01 ENCOUNTER — Other Ambulatory Visit: Payer: Self-pay | Admitting: Gynecology

## 2013-10-01 ENCOUNTER — Telehealth: Payer: Self-pay | Admitting: Gynecology

## 2013-10-01 DIAGNOSIS — Z3049 Encounter for surveillance of other contraceptives: Secondary | ICD-10-CM

## 2013-10-01 MED ORDER — LEVONORGESTREL 20 MCG/24HR IU IUD: INTRAUTERINE_SYSTEM | Freq: Once | INTRAUTERINE | Status: AC

## 2013-10-01 NOTE — Telephone Encounter (Signed)
10/01/13-LM VM for pt that her new [facility] insurance covers the Mirena & insertion at 100%, no copay, for contraception. She will call to schedule with TF.wl

## 2013-10-07 ENCOUNTER — Ambulatory Visit (INDEPENDENT_AMBULATORY_CARE_PROVIDER_SITE_OTHER): Payer: BC Managed Care – PPO | Admitting: Gynecology

## 2013-10-07 ENCOUNTER — Encounter: Payer: Self-pay | Admitting: Gynecology

## 2013-10-07 DIAGNOSIS — Z30430 Encounter for insertion of intrauterine contraceptive device: Secondary | ICD-10-CM

## 2013-10-07 HISTORY — PX: INTRAUTERINE DEVICE INSERTION: SHX323

## 2013-10-07 NOTE — Patient Instructions (Signed)
Intrauterine Device Insertion Most often, an intrauterine device (IUD) is inserted into the uterus to prevent pregnancy. There are 2 types of IUDs available:  Copper IUD--This type of IUD creates an environment that is not favorable to sperm survival. The mechanism of action of the copper IUD is not known for certain. It can stay in place for 10 years.  Hormone IUD--This type of IUD contains the hormone progestin (synthetic progesterone). The progestin thickens the cervical mucus and prevents sperm from entering the uterus, and it also thins the uterine lining. There is no evidence that the hormone IUD prevents implantation. One hormone IUD can stay in place for up to 5 years, and a different hormone IUD can stay in place for up to 3 years. An IUD is the most cost-effective birth control if left in place for the full duration. It may be removed at any time. LET YOUR HEALTH CARE PROVIDER KNOW ABOUT:  Any allergies you have.  All medicines you are taking, including vitamins, herbs, eye drops, creams, and over-the-counter medicines.  Previous problems you or members of your family have had with the use of anesthetics.  Any blood disorders you have.  Previous surgeries you have had.  Possibility of pregnancy.  Medical conditions you have. RISKS AND COMPLICATIONS  Generally, intrauterine device insertion is a safe procedure. However, as with any procedure, complications can occur. Possible complications include:  Accidental puncture (perforation) of the uterus.  Accidental placement of the IUD either in the muscle layer of the uterus (myometrium) or outside the uterus. If this happens, the IUD can be found essentially floating around the bowels and must be taken out surgically.  The IUD may fall out of the uterus (expulsion). This is more common in women who have recently had a child.   Pregnancy in the fallopian tube (ectopic).  Pelvic inflammatory disease (PID), which is infection of  the uterus and fallopian tubes. The risk of PID is slightly increased in the first 20 days after the IUD is placed, but the overall risk is still very low. BEFORE THE PROCEDURE  Schedule the IUD insertion for when you will have your menstrual period or right after, to make sure you are not pregnant. Placement of the IUD is better tolerated shortly after a menstrual cycle.  You may need to take tests or be examined to make sure you are not pregnant.  You may be required to take a pregnancy test.  You may be required to get checked for sexually transmitted infections (STIs) prior to placement. Placing an IUD in someone who has an infection can make the infection worse.  You may be given a pain reliever to take 1 or 2 hours before the procedure.  An exam will be performed to determine the size and position of your uterus.  Ask your health care provider about changing or stopping your regular medicines. PROCEDURE   A tool (speculum) is placed in the vagina. This allows your health care provider to see the lower part of the uterus (cervix).  The cervix is prepped with a medicine that lowers the risk of infection.  You may be given a medicine to numb each side of the cervix (intracervical or paracervical block). This is used to block and control any discomfort with insertion.  A tool (uterine sound) is inserted into the uterus to determine the length of the uterine cavity and the direction the uterus may be tilted.  A slim instrument (IUD inserter) is inserted through the cervical   canal and into your uterus.  The IUD is placed in the uterine cavity and the insertion device is removed.  The nylon string that is attached to the IUD and used for eventual IUD removal is trimmed. It is trimmed so that it lays high in the vagina, just outside the cervix. AFTER THE PROCEDURE  You may have bleeding after the procedure. This is normal. It varies from light spotting for a few days to menstrual-like  bleeding.  You may have mild cramping. Document Released: 09/01/2010 Document Revised: 10/24/2012 Document Reviewed: 06/24/2012 ExitCare Patient Information 2015 [employer]. This information is not intended to replace advice given to you by your health care provider. Make sure you discuss any questions you have with your health care provider.  

## 2013-10-07 NOTE — Progress Notes (Signed)
Patient presents for Mirena IUD placement. She tried the low-dose oral contraceptives but had breakthrough bleeding and has ultimately decided that she wants to proceed with the ring IUD.  She has read through the booklet, has no contraindications and signed the consent form. She currently is on a normal menses.  I reviewed the insertional process with her as well as the risks to include infection, either immediate or long-term, uterine perforation or migration requiring surgery to remove, other complications such as pain, hormonal side effects, infertility and possibility of failure with subsequent pregnancy.   Exam with [name] assistant Pelvic: External BUS vagina normal. Cervix normal with normal menses flow. Uterus anteverted normal size shape contour midline mobile nontender. Adnexa without masses or tenderness.  Procedure: The cervix was cleansed with Betadine, anterior lip grasped with a single-tooth tenaculum, the uterus was sounded and a Mirena IUD was placed according to manufacturer's recommendations without difficulty. The strings were trimmed. The patient tolerated well and will follow up in one month for a postinsertional check.  Lot number:  [id number]    Dara Lords MD, 8:24 AM 10/07/2013

## 2013-11-08 ENCOUNTER — Encounter: Payer: Self-pay | Admitting: Gynecology

## 2013-11-08 ENCOUNTER — Ambulatory Visit (INDEPENDENT_AMBULATORY_CARE_PROVIDER_SITE_OTHER): Payer: BC Managed Care – PPO | Admitting: Gynecology

## 2013-11-08 DIAGNOSIS — Z30431 Encounter for routine checking of intrauterine contraceptive device: Secondary | ICD-10-CM

## 2013-11-08 NOTE — Patient Instructions (Signed)
Follow up in April/May 2016 for annual exam. Sooner if any issues

## 2013-11-08 NOTE — Progress Notes (Signed)
Daisy Hoffman 08/16/1975 [id number]        38 y.o.  G2P1011 Present for IUD follow up exam a month after Mirena IUD placement. Doing well without complaints.  Past medical history,surgical history, problem list, medications, allergies, family history and social history were all reviewed and documented in the EPIC chart.  Directed ROS with pertinent positives and negatives documented in the history of present illness/assessment and plan.  Exam: [name] assistant General appearance:  Normal External BUS vagina normal. Cervix normal with IUD strings visualized. Slightly on the longer side and I trimmed them. Uterus grossly normal size midline mobile nontender. Adnexa without masses or tenderness.  Assessment/Plan:  38 y.o. G2P1011 with normal IUD follow up exam. Assuming she continues well then follow up in April/May 2016 for her annual exam when she is due.     Dara LordsFONTAINE,Mashell Sieben P MD, 12:12 PM 11/08/2013

## 2013-11-18 ENCOUNTER — Encounter: Payer: Self-pay | Admitting: Gynecology

## 2014-02-20 ENCOUNTER — Encounter (HOSPITAL_COMMUNITY): Payer: Self-pay

## 2014-02-20 ENCOUNTER — Emergency Department (HOSPITAL_COMMUNITY): Payer: BLUE CROSS/BLUE SHIELD

## 2014-02-20 ENCOUNTER — Emergency Department (HOSPITAL_COMMUNITY)
Admission: EM | Admit: 2014-02-20 | Discharge: 2014-02-20 | Disposition: A | Discharge status: Home / Self Care | Payer: BLUE CROSS/BLUE SHIELD | Attending: Emergency Medicine | Admitting: Emergency Medicine

## 2014-02-20 DIAGNOSIS — R079 Chest pain, unspecified: Secondary | ICD-10-CM | POA: Insufficient documentation

## 2014-02-20 DIAGNOSIS — Z79899 Other long term (current) drug therapy: Secondary | ICD-10-CM | POA: Diagnosis not present

## 2014-02-20 DIAGNOSIS — F329 Major depressive disorder, single episode, unspecified: Secondary | ICD-10-CM | POA: Diagnosis not present

## 2014-02-20 DIAGNOSIS — Z880 Allergy status to penicillin: Secondary | ICD-10-CM | POA: Insufficient documentation

## 2014-02-20 DIAGNOSIS — Z87891 Personal history of nicotine dependence: Secondary | ICD-10-CM | POA: Insufficient documentation

## 2014-02-20 DIAGNOSIS — F419 Anxiety disorder, unspecified: Secondary | ICD-10-CM | POA: Insufficient documentation

## 2014-02-20 LAB — CBC
HEMATOCRIT: 38.6 % (ref 36.0–46.0)
HEMOGLOBIN: 12.4 g/dL (ref 12.0–15.0)
MCH: 27.9 pg (ref 26.0–34.0)
MCHC: 32.1 g/dL (ref 30.0–36.0)
MCV: 86.9 fL (ref 78.0–100.0)
PLATELETS: 226 10*3/uL (ref 150–400)
RBC: 4.44 MIL/uL (ref 3.87–5.11)
RDW: 13.1 % (ref 11.5–15.5)
WBC: 6.7 10*3/uL (ref 4.0–10.5)

## 2014-02-20 LAB — BASIC METABOLIC PANEL
ANION GAP: 7 (ref 5–15)
BUN: 13 mg/dL (ref 6–23)
CO2: 26 mmol/L (ref 19–32)
CREATININE: 0.96 mg/dL (ref 0.50–1.10)
Calcium: 9.2 mg/dL (ref 8.4–10.5)
Chloride: 107 mmol/L (ref 96–112)
GFR calc non Af Amer: 74 mL/min — ABNORMAL LOW (ref >90)
GFR, EST AFRICAN AMERICAN: 85 mL/min — AB (ref >90)
GLUCOSE: 76 mg/dL (ref 70–99)
Potassium: 3.9 mmol/L (ref 3.5–5.1)
Sodium: 140 mmol/L (ref 135–145)

## 2014-02-20 LAB — I-STAT TROPONIN, ED: TROPONIN I, POC: 0 ng/mL (ref 0.00–0.08)

## 2014-02-20 LAB — BRAIN NATRIURETIC PEPTIDE: B NATRIURETIC PEPTIDE 5: 24.9 pg/mL (ref 0.0–100.0)

## 2014-02-20 LAB — D-DIMER, QUANTITATIVE: D-Dimer, Quant: <0.27 ug/mL-FEU (ref 0.00–0.48)

## 2014-02-20 MED ORDER — KETOROLAC TROMETHAMINE 30 MG/ML IJ SOLN
30.0000 mg | Freq: Once | INTRAMUSCULAR | Status: AC
  Administered 2014-02-20: 30 mg via INTRAVENOUS
  Filled 2014-02-20: qty 1

## 2014-02-20 MED ORDER — OXYCODONE-ACETAMINOPHEN 5-325 MG PO TABS
1.0000 | ORAL_TABLET | Freq: Once | ORAL | Status: AC
  Administered 2014-02-20: 1 via ORAL
  Filled 2014-02-20: qty 1

## 2014-02-20 MED ORDER — METHOCARBAMOL 750 MG PO TABS: 750.0000 mg | ORAL_TABLET | Freq: Four times a day (QID) | ORAL | Status: DC | PRN

## 2014-02-20 MED ORDER — OXYCODONE-ACETAMINOPHEN 5-325 MG PO TABS: 1.0000 | ORAL_TABLET | ORAL | Status: DC | PRN

## 2014-02-20 NOTE — ED Provider Notes (Signed)
CSN: [id number]     Arrival date & time 02/20/14  1930 History   First MD Initiated Contact with Patient 02/20/14 2028     Chief Complaint  Patient presents with  . Chest Pain     (Consider location/radiation/quality/duration/timing/severity/associated sxs/prior Treatment) HPI Comments: Patient here complaining of constant left-sided chest pain above her left breast 2 days. Patient characterizes sharp and worse with certain movements. Some dyspnea baths associated to movement of her chest wall. No cough or congestion. No leg pain or swelling. 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 Denies any fever or chills. No secure nursing to be. Denies any recent history of heavy lifting. No prior history of same. He denies any history of CAD  Patient is a 39 y.o. female presenting with chest pain. The history is provided by the patient.  Chest Pain   Past Medical History  Diagnosis Date  . Depression   . Anxiety   . 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 Allergy    Past Surgical History  Procedure Laterality Date  . Foot surgery  1997  . 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 Intrauterine device insertion  10/07/2013    Mirena   Family History  Problem Relation Age of Onset  . Diabetes Father   . Hypertension Father   . Osteoporosis Mother   . Hypertension Mother    History  Substance Use Topics  . Smoking status: Former Smoker -- 1.00 packs/day for 22 years    Types: Cigarettes    Start date: 01/25/2012    Quit date: 07/27/2012  . Smokeless tobacco: Not on file  . Alcohol Use: No   OB History    Gravida Para Term Preterm AB TAB SAB Ectopic Multiple Living   Review of Systems  Cardiovascular: Positive for chest pain.  All other systems reviewed and are negative.     Allergies    Penicillins  Home Medications   Prior to Admission medications   Medication Sig Start Date End Date Taking? Authorizing Provider  ALPRAZolam (XANAX) 0.25 MG tablet Take 0.25 mg by mouth 3 (three) times daily as needed for anxiety.     Historical Provider, MD  buPROPion (WELLBUTRIN XL) 300 MG 24 hr tablet Take 300 mg by mouth daily.    Historical Provider, MD  calcium carbonate (OS-CAL) 600 MG TABS tablet Take 600 mg by mouth daily with breakfast.    Historical Provider, MD  Cyanocobalamin (VITAMIN B-12 PO) Take by mouth.    Historical Provider, MD  ibuprofen (ADVIL,MOTRIN) 200 MG tablet Take 800 mg by mouth every 8 (eight) hours as needed for pain.    Historical Provider, MD  levonorgestrel (MIRENA) 20 MCG/24HR IUD 1 each by Intrauterine route once.     Historical Provider, MD  Multiple Vitamin (MULTIVITAMIN WITH MINERALS) TABS tablet Take 1 tablet by mouth daily.    Historical Provider, MD  venlafaxine XR (EFFEXOR-XR) 150 MG 24 hr capsule Take 150 mg by mouth daily.    Historical Provider, MD   BP 134/80 mmHg  Pulse 79  Temp(Src) 97.9 F (36.6 C) (Oral)  Resp 18  SpO2 100%  LMP 02/19/2014 Physical Exam  Constitutional: She is oriented to person, place, and time. She appears well-developed and well-nourished.  Non-toxic appearance. No distress.  HENT:  Head: Normocephalic and atraumatic.  Eyes: Conjunctivae, EOM and lids are normal. Pupils are equal, round, and reactive to light.  Neck: Normal range of motion. Neck supple. No tracheal deviation present. No thyroid mass present.  Cardiovascular: Normal rate, regular rhythm and normal heart sounds.  Exam reveals no gallop.   No murmur heard. Pulmonary/Chest: Effort normal and breath sounds normal. No stridor. No respiratory distress. She has no decreased breath sounds. She has no wheezes. She has no rhonchi. She has no rales. She exhibits bony tenderness. She exhibits no mass.    Abdominal: Soft. Normal appearance and bowel sounds  are normal. She exhibits no distension. There is no tenderness. There is no rebound and no CVA tenderness.  Musculoskeletal: Normal range of motion. She exhibits no edema or tenderness.  Neurological: She is alert and oriented to person, place, and time. She has normal strength. No cranial nerve deficit or sensory deficit. GCS eye subscore is 4. GCS verbal subscore is 5. GCS motor subscore is 6.  Skin: Skin is warm and dry. No abrasion and no rash noted.  Psychiatric: She has a normal mood and affect. Her speech is normal and behavior is normal.  Nursing note and vitals reviewed.   ED Course  Procedures (including critical care time) Labs Review Labs Reviewed  CBC  BASIC METABOLIC PANEL  BRAIN NATRIURETIC PEPTIDE  D-DIMER, QUANTITATIVE  I-STAT TROPOININ, ED    Imaging Review No results found.   EKG Interpretation   Date/Time:  Thursday February 20 2014 19:41:30 EST Ventricular Rate:  87 PR Interval:  162 QRS Duration: 77 QT Interval:  354 QTC Calculation: 426 R Axis:   3 Text Interpretation:  Sinus rhythm Inferior infarct, old Confirmed by  Keldan Eplin  MD, Nitzia Perren ([id number]) on 02/20/2014 8:58:53 PM      MDM   Final diagnoses:  Chest pain      Patient given Toradol and feels much better. Suspect chest wall pain. Do not think that this is acs . D-dimer negative. Pulmonary negative. Patient stable for discharge  Toy BakerAnthony T Zaeda Mcferran, MD 02/20/14 2252

## 2014-02-20 NOTE — ED Notes (Signed)
Pt complains of intermittent chest pain and pressure for two days, had an EKG at work and that practitioner advised her to come to the ED, patient also states that she feels short of breath and the pain is on exertion.

## 2014-02-20 NOTE — Discharge Instructions (Signed)
Chest Wall Pain °Chest wall pain is pain in or around the bones and muscles of your chest. It may take up to 6 weeks to get better. It may take longer if you must stay physically active in your work and activities.  °CAUSES  °Chest wall pain may happen on its own. However, it may be caused by: °· A viral illness like the flu. °· Injury. °· Coughing. °· Exercise. °· Arthritis. °· Fibromyalgia. °· Shingles. °HOME CARE INSTRUCTIONS  °· Avoid overtiring physical activity. Try not to strain or perform activities that cause pain. This includes any activities using your chest or your abdominal and side muscles, especially if heavy weights are used. °· Put ice on the sore area. °¨ Put ice in a plastic bag. °¨ Place a towel between your skin and the bag. °¨ Leave the ice on for 15-20 minutes per hour while awake for the first 2 days. °· Only take over-the-counter or prescription medicines for pain, discomfort, or fever as directed by your caregiver. °SEEK IMMEDIATE MEDICAL CARE IF:  °· Your pain increases, or you are very uncomfortable. °· You have a fever. °· Your chest pain becomes worse. °· You have new, unexplained symptoms. °· You have nausea or vomiting. °· You feel sweaty or lightheaded. °· You have a cough with phlegm (sputum), or you cough up blood. °MAKE SURE YOU:  °· Understand these instructions. °· Will watch your condition. °· Will get help right away if you are not doing well or get worse. °Document Released: 01/03/2005 Document Revised: 03/28/2011 Document Reviewed: 08/30/2010 °ExitCare® Patient Information ©2015 [employer]. This information is not intended to replace advice given to you by your health care provider. Make sure you discuss any questions you have with your health care provider. ° °Chest Pain (Nonspecific) °It is often hard to give a specific diagnosis for the cause of chest pain. There is always a chance that your pain could be related to something serious, such as a heart attack or a blood  clot in the lungs. You need to follow up with your health care provider for further evaluation. °CAUSES  °· Heartburn. °· Pneumonia or bronchitis. °· Anxiety or stress. °· Inflammation around your heart (pericarditis) or lung (pleuritis or pleurisy). °· A blood clot in the lung. °· A collapsed lung (pneumothorax). It can develop suddenly on its own (spontaneous pneumothorax) or from trauma to the chest. °· Shingles infection (herpes zoster virus). °The chest wall is composed of bones, muscles, and cartilage. Any of these can be the source of the pain. °· The bones can be bruised by injury. °· The muscles or cartilage can be strained by coughing or overwork. °· The cartilage can be affected by inflammation and become sore (costochondritis). °DIAGNOSIS  °Lab tests or other studies may be needed to find the cause of your pain. Your health care provider may have you take a test called an ambulatory electrocardiogram (ECG). An ECG records your heartbeat patterns over a 24-hour period. You may also have other tests, such as: °· Transthoracic echocardiogram (TTE). During echocardiography, sound waves are used to evaluate how blood flows through your heart. °· Transesophageal echocardiogram (TEE). °· Cardiac monitoring. This allows your health care provider to monitor your heart rate and rhythm in real time. °· Holter monitor. This is a portable device that records your heartbeat and can help diagnose heart arrhythmias. It allows your health care provider to track your heart activity for several days, if needed. °· Stress tests by   exercise or by giving medicine that makes the heart beat faster. °TREATMENT  °· Treatment depends on what may be causing your chest pain. Treatment may include: °¨ Acid blockers for heartburn. °¨ Anti-inflammatory medicine. °¨ Pain medicine for inflammatory conditions. °¨ Antibiotics if an infection is present. °· You may be advised to change lifestyle habits. This includes stopping smoking and  avoiding alcohol, caffeine, and chocolate. °· You may be advised to keep your head raised (elevated) when sleeping. This reduces the chance of acid going backward from your stomach into your esophagus. °Most of the time, nonspecific chest pain will improve within 2-3 days with rest and mild pain medicine.  °HOME CARE INSTRUCTIONS  °· If antibiotics were prescribed, take them as directed. Finish them even if you start to feel better. °· For the next few days, avoid physical activities that bring on chest pain. Continue physical activities as directed. °· Do not use any tobacco products, including cigarettes, chewing tobacco, or electronic cigarettes. °· Avoid drinking alcohol. °· Only take medicine as directed by your health care provider. °· Follow your health care provider's suggestions for further testing if your chest pain does not go away. °· Keep any follow-up appointments you made. If you do not go to an appointment, you could develop lasting (chronic) problems with pain. If there is any problem keeping an appointment, call to reschedule. °SEEK MEDICAL CARE IF:  °· Your chest pain does not go away, even after treatment. °· You have a rash with blisters on your chest. °· You have a fever. °SEEK IMMEDIATE MEDICAL CARE IF:  °· You have increased chest pain or pain that spreads to your arm, neck, jaw, back, or abdomen. °· You have shortness of breath. °· You have an increasing cough, or you cough up blood. °· You have severe back or abdominal pain. °· You feel nauseous or vomit. °· You have severe weakness. °· You faint. °· You have chills. °This is an emergency. Do not wait to see if the pain will go away. Get medical help at once. Call your local emergency services (911 in [place] Do not drive yourself to the hospital. °MAKE SURE YOU:  °· Understand these instructions. °· Will watch your condition. °· Will get help right away if you are not doing well or get worse. °Document Released: 10/13/2004 Document Revised:  01/08/2013 Document Reviewed: 08/09/2007 °ExitCare® Patient Information ©2015 [employer]. This information is not intended to replace advice given to you by your health care provider. Make sure you discuss any questions you have with your health care provider. ° °

## 2014-03-10 ENCOUNTER — Emergency Department (HOSPITAL_COMMUNITY)
Admission: EM | Admit: 2014-03-10 | Discharge: 2014-03-10 | Disposition: A | Discharge status: Home / Self Care | Payer: BLUE CROSS/BLUE SHIELD | Attending: Emergency Medicine | Admitting: Emergency Medicine

## 2014-03-10 ENCOUNTER — Encounter (HOSPITAL_COMMUNITY): Payer: Self-pay | Admitting: *Deleted

## 2014-03-10 ENCOUNTER — Emergency Department (HOSPITAL_COMMUNITY): Payer: BLUE CROSS/BLUE SHIELD

## 2014-03-10 DIAGNOSIS — F329 Major depressive disorder, single episode, unspecified: Secondary | ICD-10-CM | POA: Insufficient documentation

## 2014-03-10 DIAGNOSIS — R0602 Shortness of breath: Secondary | ICD-10-CM | POA: Diagnosis present

## 2014-03-10 DIAGNOSIS — F419 Anxiety disorder, unspecified: Secondary | ICD-10-CM | POA: Diagnosis not present

## 2014-03-10 DIAGNOSIS — Z87891 Personal history of nicotine dependence: Secondary | ICD-10-CM | POA: Diagnosis not present

## 2014-03-10 DIAGNOSIS — Z79899 Other long term (current) drug therapy: Secondary | ICD-10-CM | POA: Insufficient documentation

## 2014-03-10 DIAGNOSIS — R059 Cough, unspecified: Secondary | ICD-10-CM

## 2014-03-10 DIAGNOSIS — R05 Cough: Secondary | ICD-10-CM | POA: Insufficient documentation

## 2014-03-10 DIAGNOSIS — R079 Chest pain, unspecified: Secondary | ICD-10-CM

## 2014-03-10 DIAGNOSIS — Z880 Allergy status to penicillin: Secondary | ICD-10-CM | POA: Insufficient documentation

## 2014-03-10 DIAGNOSIS — R0789 Other chest pain: Secondary | ICD-10-CM | POA: Diagnosis not present

## 2014-03-10 LAB — CBC
HCT: 38.6 % (ref 36.0–46.0)
Hemoglobin: 12.3 g/dL (ref 12.0–15.0)
MCH: 27.3 pg (ref 26.0–34.0)
MCHC: 31.9 g/dL (ref 30.0–36.0)
MCV: 85.8 fL (ref 78.0–100.0)
Platelets: 241 10*3/uL (ref 150–400)
RBC: 4.5 MIL/uL (ref 3.87–5.11)
RDW: 13.3 % (ref 11.5–15.5)
WBC: 11.7 10*3/uL — AB (ref 4.0–10.5)

## 2014-03-10 LAB — BASIC METABOLIC PANEL
ANION GAP: 8 (ref 5–15)
BUN: 11 mg/dL (ref 6–23)
CALCIUM: 9 mg/dL (ref 8.4–10.5)
CHLORIDE: 107 mmol/L (ref 96–112)
CO2: 23 mmol/L (ref 19–32)
Creatinine, Ser: 0.82 mg/dL (ref 0.50–1.10)
GFR calc Af Amer: >90 mL/min (ref >90)
GFR, EST NON AFRICAN AMERICAN: 89 mL/min — AB (ref >90)
Glucose, Bld: 165 mg/dL — ABNORMAL HIGH (ref 70–99)
POTASSIUM: 3.8 mmol/L (ref 3.5–5.1)
Sodium: 138 mmol/L (ref 135–145)

## 2014-03-10 LAB — D-DIMER, QUANTITATIVE: D-Dimer, Quant: <0.27 ug/mL-FEU (ref 0.00–0.48)

## 2014-03-10 LAB — I-STAT TROPONIN, ED: Troponin i, poc: 0 ng/mL (ref 0.00–0.08)

## 2014-03-10 LAB — BRAIN NATRIURETIC PEPTIDE: B NATRIURETIC PEPTIDE 5: 91.8 pg/mL (ref 0.0–100.0)

## 2014-03-10 MED ORDER — ALBUTEROL SULFATE HFA 108 (90 BASE) MCG/ACT IN AERS
2.0000 | INHALATION_SPRAY | Freq: Once | RESPIRATORY_TRACT | Status: AC
  Administered 2014-03-10: 2 via RESPIRATORY_TRACT
  Filled 2014-03-10: qty 6.7

## 2014-03-10 NOTE — ED Notes (Signed)
Pt ambulated with pulsox, oxygen sat remained 98-99%, pulse ranged from 111-121

## 2014-03-10 NOTE — ED Notes (Addendum)
Pt reports SOB/ chest pressure since last night. Denies pain. Reports had 2.5 albuterol breathing treatment this morning with out relief. Denies chest pain. Lung sounds clear

## 2014-03-10 NOTE — ED Provider Notes (Signed)
CSN: [id number]     Arrival date & time 03/10/14  1229 History   First MD Initiated Contact with Patient 03/10/14 1315     Chief Complaint  Patient presents with  . Shortness of Breath     (Consider location/radiation/quality/duration/timing/severity/associated sxs/prior Treatment) HPI  Daisy Hoffman is a 39 y.o. female with PMH of dye, depression presenting with chest pressure and shortness of breath. Patient states she has had chest tightness that became a pressure for last 12 days that has been constant and worse with exertion. She notes that she has had a mild shortness of breath at the beginning but this is getting gradually worse and now she can only take a couple steps without becoming short of breath. Patient has had albuterol treatment with minimal relief. She is also had URI symptoms for the past week including productive cough without hemoptysis as well as left-sided sinus pressure. Patient has finished Z-Pak course as well as three days of 6 day prednisone taper from PCP. Will improvement of her symptoms. No sore throat. Pt reports fevers of 100.8. Patient states she is eating and drinking like normal. Pt denies history of DVT, PE, recent surgery or trauma, malignancy, hemoptysis, exogenous estrogen use, unilateral leg swelling or tenderness, immobilization. Patient without history of hypertension, dyslipidemia, diabetes. She is a former smoker. She is obese.    Past Medical History  Diagnosis Date  . Depression   . Anxiety   . 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 Allergy    Past Surgical History  Procedure Laterality Date  . Foot surgery  1997  . 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 Intrauterine device insertion  10/07/2013    Mirena   Family History  Problem Relation Age of Onset  . Diabetes Father   . Hypertension Father   . Osteoporosis Mother    . Hypertension Mother    History  Substance Use Topics  . Smoking status: Former Smoker -- 1.00 packs/day for 22 years    Types: Cigarettes    Start date: 01/25/2012    Quit date: 07/27/2012  . Smokeless tobacco: Not on file  . Alcohol Use: No   OB History    Gravida Para Term Preterm AB TAB SAB Ectopic Multiple Living   2 1 1  1     1      Review of Systems 10 Systems reviewed and are negative for acute change except as noted in the HPI.    Allergies  Penicillins  Home Medications   Prior to Admission medications   Medication Sig Start Date End Date Taking? Authorizing Provider  albuterol (PROVENTIL) (2.5 MG/3ML) 0.083% nebulizer solution Take 2.5 mg by nebulization every 6 (six) hours as needed for wheezing or shortness of breath.   Yes Historical Provider, MD  ALPRAZolam (XANAX) 0.25 MG tablet Take 0.25 mg by mouth 3 (three) times daily as needed for anxiety.    Yes Historical Provider, MD  buPROPion (WELLBUTRIN XL) 300 MG 24 hr tablet Take 300 mg by mouth daily.   Yes Historical Provider, MD  DOCUSATE SODIUM PO Take by mouth.   Yes Historical Provider, MD  ibuprofen (ADVIL,MOTRIN) 200 MG tablet Take 200 mg by mouth every 8 (eight) hours as needed for mild pain.    Yes Historical Provider, MD  methocarbamol (ROBAXIN-750) 750 MG tablet Take 1 tablet (750 mg total) by mouth every 6 (six)  hours as needed for muscle spasms. 02/20/14  Yes Toy Baker, MD  omeprazole (PRILOSEC OTC) 20 MG tablet Take 20 mg by mouth daily.   Yes Historical Provider, MD  predniSONE (STERAPRED UNI-PAK) 5 MG TABS tablet Take by mouth. Taper per package instructions.   Yes Historical Provider, MD  venlafaxine XR (EFFEXOR-XR) 150 MG 24 hr capsule Take 150 mg by mouth daily.   Yes Historical Provider, MD  azithromycin (ZITHROMAX) 250 MG tablet Take by mouth daily.    Historical Provider, MD  oxyCODONE-acetaminophen (PERCOCET/ROXICET) 5-325 MG per tablet Take 1-2 tablets by mouth every 4 (four) hours as  needed for severe pain. Patient not taking: Reported on 03/10/2014 02/20/14   Toy Baker, MD   BP 124/82 mmHg  Pulse 88  Temp(Src) 98 F (36.7 C) (Oral)  Resp 21  SpO2 97%  LMP 02/19/2014 Physical Exam  Constitutional: She appears well-developed and well-nourished. No distress.  HENT:  Head: Normocephalic and atraumatic.  Eyes: Conjunctivae and EOM are normal. Right eye exhibits no discharge. Left eye exhibits no discharge.  Neck: No JVD present.  Cardiovascular: Normal rate and regular rhythm.   No peripheral edema or tenderness. No palpable cord. Negative Homans sign.  Pulmonary/Chest: Effort normal and breath sounds normal. No respiratory distress. She has no wheezes.  Abdominal: Soft. Bowel sounds are normal. She exhibits no distension. There is no tenderness.  Neurological: She is alert. She exhibits normal muscle tone. Coordination normal.  Skin: Skin is warm and dry. She is not diaphoretic.  Nursing note and vitals reviewed.   ED Course  Procedures (including critical care time) Labs Review Labs Reviewed  CBC - Abnormal; Notable for the following:    WBC 11.7 (*)    All other components within normal limits  BASIC METABOLIC PANEL - Abnormal; Notable for the following:    Glucose, Bld 165 (*)    GFR calc non Af Amer 89 (*)    All other components within normal limits  BRAIN NATRIURETIC PEPTIDE  D-DIMER, QUANTITATIVE  Rosezena Sensor, ED    Imaging Review Dg Chest Port 1 View  03/10/2014   CLINICAL DATA:  Increasing shortness of breath  EXAM: PORTABLE CHEST - 1 VIEW  COMPARISON:  02/20/2014  FINDINGS: The heart size and mediastinal contours are within normal limits. Both lungs are clear. The visualized skeletal structures are unremarkable.  IMPRESSION: No active disease.   Electronically Signed   By: Jolaine Click M.D.   On: 03/10/2014 14:03     EKG Interpretation   Date/Time:  Monday March 10 2014 12:38:16 EST Ventricular Rate:  97 PR Interval:  135 QRS  Duration: 77 QT Interval:  334 QTC Calculation: 424 R Axis:   15 Text Interpretation:  Sinus rhythm Low voltage, precordial leads No  significant change since last tracing Confirmed by [facility]  MD, MARTHA  [phone]) on 03/10/2014 12:40:10 PM      MDM   Final diagnoses:  Chest pain, unspecified chest pain type  Shortness of breath  Cough   Chest pain is not likely of cardiac or pulmonary etiology d/t presentation, pt low risk by Well's and d-dimer negative, low risk HEART score. No hypoxia or tachycardia. Pt with tachypnea. No respiratory distress. No JVD or new murmur, RRR, breath sounds equal bilaterally, EKG without acute abnormalities, negative troponin with over 6 hours of continuous chest pressure, and negative CXR. I doubt PE or ACS. Pt with reported fevers, persistent cough, SOB, worse with cough. Pt with 22 pack  year smoking history. Pt with 3 more days of prednisone taper and finished abx today. Suspect viral syndrome verses COPD like exacerbation. Patient ambulated in ED with oxygen saturations 98-99%. Pt given inhaler in Ed. Patient stable for discharge and PCP follow up.   Discussed return precautions with patient. Discussed all results and patient verbalizes understanding and agrees with plan.  Case has been discussed with Dr. Karma Ganja who agrees with the above plan and to discharge.     Louann Sjogren, PA-C 03/10/14 1641  Ethelda Chick, MD 03/12/14 [phone]

## 2014-03-10 NOTE — Discharge Instructions (Signed)
Return to the emergency room with worsening of symptoms, new symptoms or with symptoms that are concerning, especially fevers not controlled with tylenol or ibuprofen, worsening shortness of breath or chest pain, coughing up blood, swelling in legs or tenderness. Use albuterol inhaler for cough and shortness of breath. Continue prednisone. Please call your doctor for a followup appointment within 24-48 hours. When you talk to your doctor please let them know that you were seen in the emergency department and have them acquire all of your records so that they can discuss the findings with you and formulate a treatment plan to fully care for your new and ongoing problems.  Read below information and follow recommendations.  Shortness of Breath Shortness of breath means you have trouble breathing. It could also mean that you have a medical problem. You should get immediate medical care for shortness of breath. CAUSES   Not enough oxygen in the air such as with high altitudes or a smoke-filled room.  Certain lung diseases, infections, or problems.  Heart disease or conditions, such as angina or heart failure.  Low red blood cells (anemia).  Poor physical fitness, which can cause shortness of breath when you exercise.  Chest or back injuries or stiffness.  Being overweight.  Smoking.  Anxiety, which can make you feel like you are not getting enough air. DIAGNOSIS  Serious medical problems can often be found during your physical exam. Tests may also be done to determine why you are having shortness of breath. Tests may include:  Chest X-rays.  Lung function tests.  Blood tests.  An electrocardiogram (ECG).  An ambulatory electrocardiogram. An ambulatory ECG records your heartbeat patterns over a 24-hour period.  Exercise testing.  A transthoracic echocardiogram (TTE). During echocardiography, sound waves are used to evaluate how blood flows through your heart.  A transesophageal  echocardiogram (TEE).  Imaging scans. Your health care provider may not be able to find a cause for your shortness of breath after your exam. In this case, it is important to have a follow-up exam with your health care provider as directed.  TREATMENT  Treatment for shortness of breath depends on the cause of your symptoms and can vary greatly. HOME CARE INSTRUCTIONS   Do not smoke. Smoking is a common cause of shortness of breath. If you smoke, ask for help to quit.  Avoid being around chemicals or things that may bother your breathing, such as paint fumes and dust.  Rest as needed. Slowly resume your usual activities.  If medicines were prescribed, take them as directed for the full length of time directed. This includes oxygen and any inhaled medicines.  Keep all follow-up appointments as directed by your health care provider. SEEK MEDICAL CARE IF:   Your condition does not improve in the time expected.  You have a hard time doing your normal activities even with rest.  You have any new symptoms. SEEK IMMEDIATE MEDICAL CARE IF:   Your shortness of breath gets worse.  You feel light-headed, faint, or develop a cough not controlled with medicines.  You start coughing up blood.  You have pain with breathing.  You have chest pain or pain in your arms, shoulders, or abdomen.  You have a fever.  You are unable to walk up stairs or exercise the way you normally do. MAKE SURE YOU:  Understand these instructions.  Will watch your condition.  Will get help right away if you are not doing well or get worse. Document Released: 09/28/2000  Document Revised: 01/08/2013 Document Reviewed: 03/21/2011 [facility] Patient Information 2015 [place], [place]. This information is not intended to replace advice given to you by your health care provider. Make sure you discuss any questions you have with your health care provider.    Chest Pain (Nonspecific) It is often hard to give a  specific diagnosis for the cause of chest pain. There is always a chance that your pain could be related to something serious, such as a heart attack or a blood clot in the lungs. You need to follow up with your health care provider for further evaluation. CAUSES   Heartburn.  Pneumonia or bronchitis.  Anxiety or stress.  Inflammation around your heart (pericarditis) or lung (pleuritis or pleurisy).  A blood clot in the lung.  A collapsed lung (pneumothorax). It can develop suddenly on its own (spontaneous pneumothorax) or from trauma to the chest.  Shingles infection (herpes zoster virus). The chest wall is composed of bones, muscles, and cartilage. Any of these can be the source of the pain.  The bones can be bruised by injury.  The muscles or cartilage can be strained by coughing or overwork.  The cartilage can be affected by inflammation and become sore (costochondritis). DIAGNOSIS  Lab tests or other studies may be needed to find the cause of your pain. Your health care provider may have you take a test called an ambulatory electrocardiogram (ECG). An ECG records your heartbeat patterns over a 24-hour period. You may also have other tests, such as:  Transthoracic echocardiogram (TTE). During echocardiography, sound waves are used to evaluate how blood flows through your heart.  Transesophageal echocardiogram (TEE).  Cardiac monitoring. This allows your health care provider to monitor your heart rate and rhythm in real time.  Holter monitor. This is a portable device that records your heartbeat and can help diagnose heart arrhythmias. It allows your health care provider to track your heart activity for several days, if needed.  Stress tests by exercise or by giving medicine that makes the heart beat faster. TREATMENT   Treatment depends on what may be causing your chest pain. Treatment may include:  Acid blockers for heartburn.  Anti-inflammatory medicine.  Pain  medicine for inflammatory conditions.  Antibiotics if an infection is present.  You may be advised to change lifestyle habits. This includes stopping smoking and avoiding alcohol, caffeine, and chocolate.  You may be advised to keep your head raised (elevated) when sleeping. This reduces the chance of acid going backward from your stomach into your esophagus. Most of the time, nonspecific chest pain will improve within 2-3 days with rest and mild pain medicine.  HOME CARE INSTRUCTIONS   If antibiotics were prescribed, take them as directed. Finish them even if you start to feel better.  For the next few days, avoid physical activities that bring on chest pain. Continue physical activities as directed.  Do not use any tobacco products, including cigarettes, chewing tobacco, or electronic cigarettes.  Avoid drinking alcohol.  Only take medicine as directed by your health care provider.  Follow your health care provider's suggestions for further testing if your chest pain does not go away.  Keep any follow-up appointments you made. If you do not go to an appointment, you could develop lasting (chronic) problems with pain. If there is any problem keeping an appointment, call to reschedule. SEEK MEDICAL CARE IF:   Your chest pain does not go away, even after treatment.  You have a rash with blisters on your  chest.  You have a fever. SEEK IMMEDIATE MEDICAL CARE IF:   You have increased chest pain or pain that spreads to your arm, neck, jaw, back, or abdomen.  You have shortness of breath.  You have an increasing cough, or you cough up blood.  You have severe back or abdominal pain.  You feel nauseous or vomit.  You have severe weakness.  You faint.  You have chills. This is an emergency. Do not wait to see if the pain will go away. Get medical help at once. Call your local emergency services (911 in [place] Do not drive yourself to the hospital. MAKE SURE YOU:   Understand  these instructions.  Will watch your condition.  Will get help right away if you are not doing well or get worse. Document Released: 10/13/2004 Document Revised: 01/08/2013 Document Reviewed: 08/09/2007 [facility] Patient Information 2015 [place], [place]. This information is not intended to replace advice given to you by your health care provider. Make sure you discuss any questions you have with your health care provider.

## 2014-03-18 ENCOUNTER — Institutional Professional Consult (permissible substitution): Payer: BLUE CROSS/BLUE SHIELD | Admitting: Internal Medicine

## 2015-08-25 ENCOUNTER — Other Ambulatory Visit: Payer: Self-pay | Admitting: Nurse Practitioner

## 2015-08-25 DIAGNOSIS — Z1231 Encounter for screening mammogram for malignant neoplasm of breast: Secondary | ICD-10-CM

## 2015-08-31 ENCOUNTER — Ambulatory Visit
Admission: RE | Admit: 2015-08-31 | Discharge: 2015-08-31 | Disposition: A | Discharge status: Home / Self Care | Payer: BLUE CROSS/BLUE SHIELD | Source: Ambulatory Visit | Attending: Nurse Practitioner | Admitting: Nurse Practitioner

## 2015-08-31 DIAGNOSIS — Z1231 Encounter for screening mammogram for malignant neoplasm of breast: Secondary | ICD-10-CM

## 2015-09-24 IMAGING — CR DG CHEST 1V PORT
1 series · 1 of 1 positions shown · non-contrast
Comparison: 02/20/2014

CLINICAL DATA: Increasing shortness of breath

EXAM:
PORTABLE CHEST - 1 VIEW

[AP]
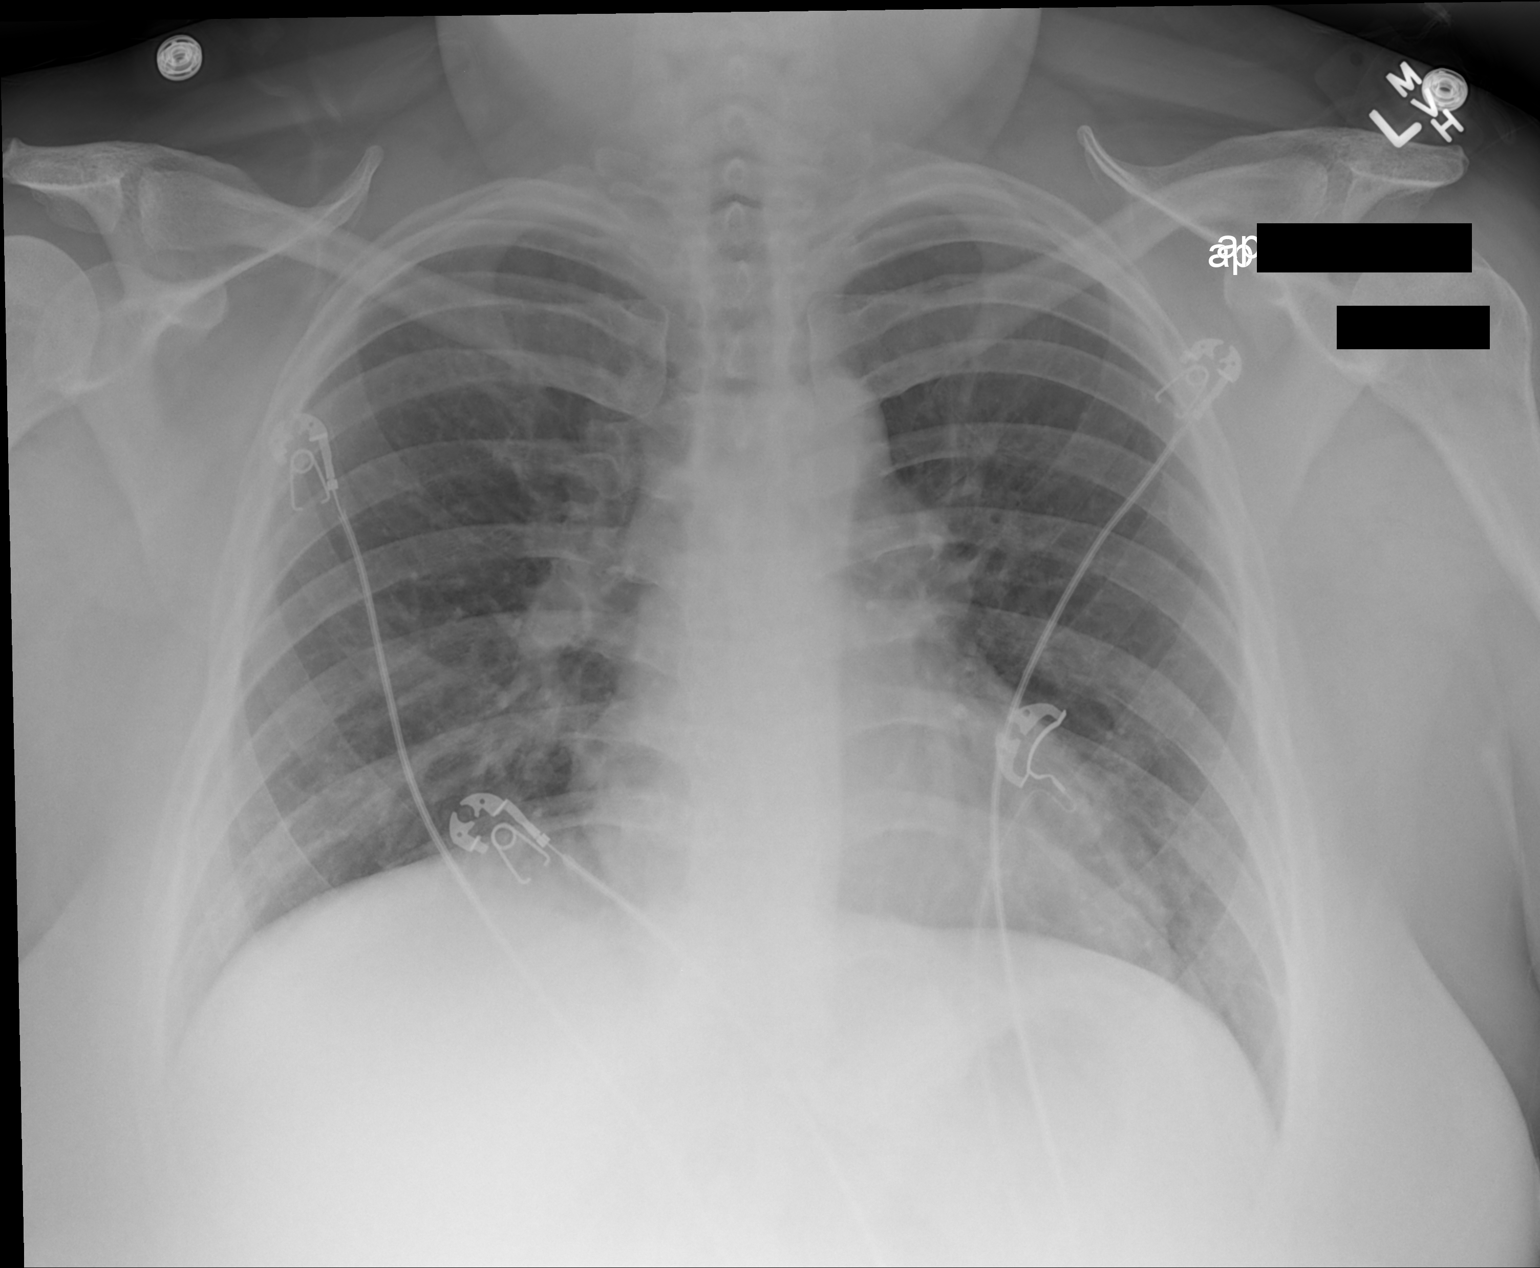

[1 of 1 positions shown; findings below may reference images not displayed]

FINDINGS: The heart size and mediastinal contours are within normal limits.
Both lungs are clear. The visualized skeletal structures are
unremarkable.
IMPRESSION: No active disease.

## 2016-03-29 ENCOUNTER — Other Ambulatory Visit: Payer: Self-pay | Admitting: Orthopedic Surgery

## 2016-04-21 ENCOUNTER — Encounter (HOSPITAL_BASED_OUTPATIENT_CLINIC_OR_DEPARTMENT_OTHER): Payer: Self-pay | Admitting: *Deleted

## 2016-04-22 ENCOUNTER — Encounter (HOSPITAL_BASED_OUTPATIENT_CLINIC_OR_DEPARTMENT_OTHER): Payer: Self-pay | Admitting: *Deleted

## 2016-04-28 ENCOUNTER — Encounter (HOSPITAL_BASED_OUTPATIENT_CLINIC_OR_DEPARTMENT_OTHER): Payer: Self-pay | Admitting: Anesthesiology

## 2016-04-28 ENCOUNTER — Ambulatory Visit (HOSPITAL_BASED_OUTPATIENT_CLINIC_OR_DEPARTMENT_OTHER)
Admission: RE | Admit: 2016-04-28 | Discharge: 2016-04-28 | Disposition: A | Discharge status: Home / Self Care | Payer: Medicaid Other | Source: Ambulatory Visit | Attending: Orthopedic Surgery | Admitting: Orthopedic Surgery

## 2016-04-28 ENCOUNTER — Ambulatory Visit (HOSPITAL_BASED_OUTPATIENT_CLINIC_OR_DEPARTMENT_OTHER): Payer: Medicaid Other | Admitting: Anesthesiology

## 2016-04-28 ENCOUNTER — Encounter (HOSPITAL_BASED_OUTPATIENT_CLINIC_OR_DEPARTMENT_OTHER)
Admission: RE | Disposition: A | Discharge status: Home / Self Care | Payer: Self-pay | Source: Ambulatory Visit | Attending: Orthopedic Surgery

## 2016-04-28 DIAGNOSIS — F419 Anxiety disorder, unspecified: Secondary | ICD-10-CM | POA: Insufficient documentation

## 2016-04-28 DIAGNOSIS — G5603 Carpal tunnel syndrome, bilateral upper limbs: Secondary | ICD-10-CM | POA: Diagnosis not present

## 2016-04-28 DIAGNOSIS — Z87891 Personal history of nicotine dependence: Secondary | ICD-10-CM | POA: Diagnosis not present

## 2016-04-28 DIAGNOSIS — F329 Major depressive disorder, single episode, unspecified: Secondary | ICD-10-CM | POA: Diagnosis not present

## 2016-04-28 DIAGNOSIS — K219 Gastro-esophageal reflux disease without esophagitis: Secondary | ICD-10-CM | POA: Diagnosis not present

## 2016-04-28 DIAGNOSIS — Z9884 Bariatric surgery status: Secondary | ICD-10-CM | POA: Insufficient documentation

## 2016-04-28 DIAGNOSIS — Z79899 Other long term (current) drug therapy: Secondary | ICD-10-CM | POA: Diagnosis not present

## 2016-04-28 HISTORY — DX: Gastro-esophageal reflux disease without esophagitis: K21.9

## 2016-04-28 HISTORY — DX: Snoring: R06.83

## 2016-04-28 HISTORY — PX: CARPAL TUNNEL RELEASE: SHX101

## 2016-04-28 HISTORY — DX: Anemia, unspecified: D64.9

## 2016-04-28 SURGERY — CARPAL TUNNEL RELEASE
Anesthesia: Regional | Site: Hand | Laterality: Right

## 2016-04-28 MED ORDER — BUPIVACAINE HCL (PF) 0.25 % IJ SOLN
INTRAMUSCULAR | Status: DC | PRN
  Administered 2016-04-28: 10 mL

## 2016-04-28 MED ORDER — FENTANYL CITRATE (PF) 100 MCG/2ML IJ SOLN
INTRAMUSCULAR | Status: AC
Start: 2016-04-28 — End: 2016-04-28
  Filled 2016-04-28: qty 2

## 2016-04-28 MED ORDER — CHLORHEXIDINE GLUCONATE 4 % EX LIQD: 60.0000 mL | Freq: Once | CUTANEOUS | Status: DC

## 2016-04-28 MED ORDER — MEPERIDINE HCL 25 MG/ML IJ SOLN: 6.2500 mg | INTRAMUSCULAR | Status: DC | PRN

## 2016-04-28 MED ORDER — HYDROCODONE-ACETAMINOPHEN 5-325 MG PO TABS: ORAL_TABLET | ORAL | 0 refills | Status: DC

## 2016-04-28 MED ORDER — FENTANYL CITRATE (PF) 100 MCG/2ML IJ SOLN
50.0000 ug | INTRAMUSCULAR | Status: AC | PRN
  Administered 2016-04-28: 25 ug via INTRAVENOUS
  Administered 2016-04-28: 50 ug via INTRAVENOUS
  Administered 2016-04-28: 25 ug via INTRAVENOUS

## 2016-04-28 MED ORDER — VANCOMYCIN HCL IN DEXTROSE 1-5 GM/200ML-% IV SOLN
INTRAVENOUS | Status: AC
  Filled 2016-04-28: qty 200

## 2016-04-28 MED ORDER — VANCOMYCIN HCL IN DEXTROSE 1-5 GM/200ML-% IV SOLN
1000.0000 mg | INTRAVENOUS | Status: AC
  Administered 2016-04-28: 1000 mg via INTRAVENOUS

## 2016-04-28 MED ORDER — MIDAZOLAM HCL 2 MG/2ML IJ SOLN
1.0000 mg | INTRAMUSCULAR | Status: DC | PRN
  Administered 2016-04-28: 1 mg via INTRAVENOUS
  Administered 2016-04-28 (×2): 0.5 mg via INTRAVENOUS

## 2016-04-28 MED ORDER — PROPOFOL 10 MG/ML IV BOLUS
INTRAVENOUS | Status: DC | PRN
  Administered 2016-04-28 (×4): 10 mg via INTRAVENOUS

## 2016-04-28 MED ORDER — SCOPOLAMINE 1 MG/3DAYS TD PT72: 1.0000 | MEDICATED_PATCH | Freq: Once | TRANSDERMAL | Status: DC | PRN

## 2016-04-28 MED ORDER — MIDAZOLAM HCL 2 MG/2ML IJ SOLN
INTRAMUSCULAR | Status: AC
  Filled 2016-04-28: qty 2

## 2016-04-28 MED ORDER — ONDANSETRON HCL 4 MG/2ML IJ SOLN: 4.0000 mg | Freq: Once | INTRAMUSCULAR | Status: DC | PRN

## 2016-04-28 MED ORDER — ONDANSETRON HCL 4 MG/2ML IJ SOLN
INTRAMUSCULAR | Status: AC
  Filled 2016-04-28: qty 2

## 2016-04-28 MED ORDER — LIDOCAINE 2% (20 MG/ML) 5 ML SYRINGE
INTRAMUSCULAR | Status: AC
  Filled 2016-04-28: qty 5

## 2016-04-28 MED ORDER — HYDROMORPHONE HCL 1 MG/ML IJ SOLN: 0.2500 mg | INTRAMUSCULAR | Status: DC | PRN

## 2016-04-28 MED ORDER — LACTATED RINGERS IV SOLN
INTRAVENOUS | Status: DC
  Administered 2016-04-28: 12:00:00 via INTRAVENOUS

## 2016-04-28 MED ORDER — LIDOCAINE HCL (PF) 0.5 % IJ SOLN
INTRAMUSCULAR | Status: DC | PRN
  Administered 2016-04-28: 35 mL via INTRAVENOUS

## 2016-04-28 SURGICAL SUPPLY — 34 items
BANDAGE ACE 3X5.8 VEL STRL LF (GAUZE/BANDAGES/DRESSINGS) ×3 IMPLANT
BLADE SURG 15 STRL LF DISP TIS (BLADE) ×2 IMPLANT
BLADE SURG 15 STRL SS (BLADE) ×6
BNDG CMPR 9X4 STRL LF SNTH (GAUZE/BANDAGES/DRESSINGS) ×1
BNDG ESMARK 4X9 LF (GAUZE/BANDAGES/DRESSINGS) ×2 IMPLANT
BNDG GAUZE ELAST 4 BULKY (GAUZE/BANDAGES/DRESSINGS) ×3 IMPLANT
CHLORAPREP W/TINT 26ML (MISCELLANEOUS) ×3 IMPLANT
CORDS BIPOLAR (ELECTRODE) ×3 IMPLANT
COVER BACK TABLE 60X90IN (DRAPES) ×3 IMPLANT
COVER MAYO STAND STRL (DRAPES) ×3 IMPLANT
CUFF TOURNIQUET SINGLE 18IN (TOURNIQUET CUFF) ×3 IMPLANT
DRAPE EXTREMITY T 121X128X90 (DRAPE) ×3 IMPLANT
DRAPE SURG 17X23 STRL (DRAPES) ×3 IMPLANT
DRSG PAD ABDOMINAL 8X10 ST (GAUZE/BANDAGES/DRESSINGS) ×3 IMPLANT
GAUZE SPONGE 4X4 12PLY STRL (GAUZE/BANDAGES/DRESSINGS) ×3 IMPLANT
GAUZE XEROFORM 1X8 LF (GAUZE/BANDAGES/DRESSINGS) ×3 IMPLANT
GLOVE BIO SURGEON STRL SZ7.5 (GLOVE) ×3 IMPLANT
GLOVE BIOGEL PI IND STRL 8 (GLOVE) ×1 IMPLANT
GLOVE BIOGEL PI INDICATOR 8 (GLOVE) ×2
GOWN STRL REUS W/ TWL LRG LVL3 (GOWN DISPOSABLE) ×1 IMPLANT
GOWN STRL REUS W/TWL LRG LVL3 (GOWN DISPOSABLE) ×3
GOWN STRL REUS W/TWL XL LVL3 (GOWN DISPOSABLE) ×3 IMPLANT
NDL HYPO 25X1 1.5 SAFETY (NEEDLE) ×1 IMPLANT
NEEDLE HYPO 25X1 1.5 SAFETY (NEEDLE) ×3 IMPLANT
NS IRRIG 1000ML POUR BTL (IV SOLUTION) ×3 IMPLANT
PACK BASIN DAY SURGERY FS (CUSTOM PROCEDURE TRAY) ×3 IMPLANT
PADDING CAST ABS 4INX4YD NS (CAST SUPPLIES) ×2
PADDING CAST ABS COTTON 4X4 ST (CAST SUPPLIES) ×1 IMPLANT
STOCKINETTE 4X48 STRL (DRAPES) ×3 IMPLANT
SUT ETHILON 4 0 PS 2 18 (SUTURE) ×3 IMPLANT
SYR BULB 3OZ (MISCELLANEOUS) ×3 IMPLANT
SYR CONTROL 10ML LL (SYRINGE) ×3 IMPLANT
TOWEL OR 17X24 6PK STRL BLUE (TOWEL DISPOSABLE) ×6 IMPLANT
UNDERPAD 30X30 (UNDERPADS AND DIAPERS) ×3 IMPLANT

## 2016-04-28 NOTE — Brief Op Note (Signed)
04/28/2016  2:58 PM  PATIENT:  Daisy Hoffman  41 y.o. female  PRE-OPERATIVE DIAGNOSIS:  RIGHT CARPAL TUNNEL SYNDROME  POST-OPERATIVE DIAGNOSIS:  RIGHT CARPAL TUNNEL SYNDROME  PROCEDURE:  Procedure(s): RIGHT CARPAL TUNNEL RELEASE (Right)  SURGEON:  Surgeon(s) and Role:    * Betha Loa, MD - Primary  PHYSICIAN ASSISTANT:   ASSISTANTS: none   ANESTHESIA:   Bier block with sedation  EBL:  No intake/output data recorded.  BLOOD ADMINISTERED:none  DRAINS: none   LOCAL MEDICATIONS USED:  MARCAINE     SPECIMEN:  No Specimen  DISPOSITION OF SPECIMEN:  N/A  COUNTS:  YES  TOURNIQUET:   Total Tourniquet Time Documented: Forearm (Right) - 30 minutes Total: Forearm (Right) - 30 minutes   DICTATION: .Note written in EPIC  PLAN OF CARE: Discharge to home after PACU  PATIENT DISPOSITION:  PACU - hemodynamically stable.

## 2016-04-28 NOTE — Discharge Instructions (Addendum)

## 2016-04-28 NOTE — Anesthesia Postprocedure Evaluation (Signed)
Anesthesia Post Note  Patient: Daisy Hoffman  Procedure(s) Performed: Procedure(s) (LRB): RIGHT CARPAL TUNNEL RELEASE (Right)  Patient location during evaluation: PACU Anesthesia Type: Bier Block Level of consciousness: awake and alert and patient cooperative Pain management: pain level controlled Vital Signs Assessment: post-procedure vital signs reviewed and stable Respiratory status: spontaneous breathing and respiratory function stable Cardiovascular status: stable Anesthetic complications: no       Last Vitals:  Vitals:   04/28/16 1515 04/28/16 1541  BP: 130/80 (!) 147/92  Pulse: 87 86  Resp: 15   Temp:  36.7 C    Last Pain:  Vitals:   04/28/16 1541  TempSrc:   PainSc: 0-No pain                 Elynor Kallenberger DAVID

## 2016-04-28 NOTE — Anesthesia Preprocedure Evaluation (Signed)
Anesthesia Evaluation  Patient identified by MRN, date of birth, ID band Patient awake    Reviewed: Allergy & Precautions, NPO status , Patient's Chart, lab work & pertinent test results  Airway Mallampati: I  TM Distance: >3 FB Neck ROM: Full    Dental   Pulmonary former smoker,    Pulmonary exam normal        Cardiovascular Normal cardiovascular exam     Neuro/Psych    GI/Hepatic GERD  Medicated and Controlled,  Endo/Other    Renal/GU      Musculoskeletal   Abdominal   Peds  Hematology   Anesthesia Other Findings   Reproductive/Obstetrics                             Anesthesia Physical Anesthesia Plan  ASA: II  Anesthesia Plan: Bier Block   Post-op Pain Management:    Induction: Intravenous  Airway Management Planned: Simple Face Mask  Additional Equipment:   Intra-op Plan:   Post-operative Plan:   Informed Consent: I have reviewed the patients History and Physical, chart, labs and discussed the procedure including the risks, benefits and alternatives for the proposed anesthesia with the patient or authorized representative who has indicated his/her understanding and acceptance.     Plan Discussed with: CRNA and Surgeon  Anesthesia Plan Comments:         Anesthesia Quick Evaluation  

## 2016-04-28 NOTE — H&P (Signed)
  Daisy Hoffman is an 41 y.o. female.   Chief Complaint: right carpal tunnel syndrome HPI: 41 yo female with bilateral carpal tunnel syndrome.  Numbness and tingling and nocturnal symptoms.  Positive nerve conduction studies.  She wishes to have a carpal tunnel release.  Allergies:  Allergies  Allergen Reactions  . Omnicef [Cefdinir] Swelling    rash  . Penicillins Rash    Past Medical History:  Diagnosis Date  . Allergy   . Anemia   . Anxiety   . Depression   . GERD (gastroesophageal reflux disease)    occ, due to gastric lapband  . LGSIL (low grade squamous intraepithelial dysplasia) 11/2005, 11/2006, 02/2007, 08/2008.,02/2008   C&B LGSIL 11/2005, C&B 05/2006 PAP LGSIL A FEW HIGHER,  . Snores     Past Surgical History:  Procedure Laterality Date  . FOOT SURGERY  1997  . INTRAUTERINE DEVICE INSERTION  10/07/2013   Mirena  . LAPAROSCOPIC GASTRIC BANDING  04/2008    Family History: Family History  Problem Relation Age of Onset  . Diabetes Father   . Hypertension Father   . Osteoporosis Mother   . Hypertension Mother     Social History:   reports that she quit smoking about 3 years ago. Her smoking use included Cigarettes. She started smoking about 4 years ago. She has a 22.00 pack-year smoking history. She has never used smokeless tobacco. She reports that she drinks alcohol. She reports that she does not use drugs.  Medications: Facility-Administered Medications Prior to Admission  Medication Dose Route Frequency Provider Last Rate Last Dose  . levonorgestrel (MIRENA) 20 MCG/24HR IUD   Intrauterine Once Dara Lords, MD       Medications Prior to Admission  Medication Sig Dispense Refill  . buPROPion (WELLBUTRIN XL) 300 MG 24 hr tablet Take 300 mg by mouth daily.    Marland Kitchen ibuprofen (ADVIL,MOTRIN) 200 MG tablet Take 200 mg by mouth every 8 (eight) hours as needed for mild pain.     Marland Kitchen omeprazole (PRILOSEC OTC) 20 MG tablet Take 20 mg by mouth daily.    Marland Kitchen  venlafaxine XR (EFFEXOR-XR) 150 MG 24 hr capsule Take 150 mg by mouth daily.    Marland Kitchen ALPRAZolam (XANAX) 0.25 MG tablet Take 0.25 mg by mouth 3 (three) times daily as needed for anxiety.       No results found for this or any previous visit (from the past 48 hour(s)).  No results found.   A comprehensive review of systems was negative.  Blood pressure 129/82, pulse 83, temperature 98.2 F (36.8 C), temperature source Oral, resp. rate 16, height  (1.575 m), weight 108.1 kg (238 lb 6 oz), last menstrual period 04/08/2016, SpO2 100 %.  General appearance: alert, cooperative and appears stated age Head: Normocephalic, without obvious abnormality, atraumatic Neck: supple, symmetrical, trachea midline Resp: clear to auscultation bilaterally Cardio: regular rate and rhythm GI: non-tender Extremities: Intact sensation and capillary refill all digits.  +epl/fpl/io.  No wounds.  Pulses: 2+ and symmetric Skin: Skin color, texture, turgor normal. No rashes or lesions Neurologic: Grossly normal Incision/Wound:none  Assessment/Plan Right carpal tunnel syndrome.  Non operative and operative treatment options were discussed with the patient and patient wishes to proceed with operative treatment. Risks, benefits, and alternatives of surgery were discussed and the patient agrees with the plan of care.   Bradley Handyside R 04/28/2016, 1:57 PM

## 2016-04-28 NOTE — Transfer of Care (Signed)
Immediate Anesthesia Transfer of Care Note  Patient: Daisy Hoffman  Procedure(s) Performed: Procedure(s): RIGHT CARPAL TUNNEL RELEASE (Right)  Patient Location: PACU  Anesthesia Type:Bier block  Level of Consciousness: awake, alert  and oriented  Airway & Oxygen Therapy: Patient Spontanous Breathing  Post-op Assessment: Report given to RN and Post -op Vital signs reviewed and stable  Post vital signs: Reviewed and stable  Last Vitals:  Vitals:   04/28/16 1142  BP: 129/82  Pulse: 83  Resp: 16  Temp: 36.8 C    Last Pain:  Vitals:   04/28/16 1142  TempSrc: Oral  PainSc: 4          Complications: No apparent anesthesia complications

## 2016-04-28 NOTE — Op Note (Addendum)
04/28/2016 [facility]                              OPERATIVE REPORT   PREOPERATIVE DIAGNOSIS:  Right carpal tunnel syndrome.  POSTOPERATIVE DIAGNOSIS:  Right carpal tunnel syndrome.  PROCEDURE:  Right carpal tunnel release.  SURGEON:  Betha Loa, MD  ASSISTANT:  none.  ANESTHESIA:  Bier block and sedation.  IV FLUIDS:  Per anesthesia flow sheet.  ESTIMATED BLOOD LOSS:  Minimal.  COMPLICATIONS:  None.  SPECIMENS:  None.  TOURNIQUET TIME:    Total Tourniquet Time Documented: Forearm (Right) - 30 minutes Total: Forearm (Right) - 30 minutes   DISPOSITION:  Stable to PACU.  LOCATION: [facility]  INDICATIONS:  41 yo female with pins and needles sensation in digits and positive nerve conduction studies.  She wishes to have a carpal tunnel release for management of her symptoms.  Risks, benefits and alternatives of surgery were discussed including the risk of blood loss; infection; damage to nerves, vessels, tendons, ligaments, bone; failure of surgery; need for additional surgery; complications with wound healing; continued pain; recurrence of carpal tunnel syndrome; and damage to motor branch. She voiced understanding of these risks and elected to proceed.   OPERATIVE COURSE:  After being identified preoperatively by myself, the patient and I agreed upon the procedure and site of procedure.  The surgical site was marked.  The risks, benefits, and alternatives of the surgery were reviewed and she wished to proceed.  Surgical consent had been signed.  She was given IV vancomycin as preoperative antibiotic prophylaxis.  She was transferred to the operating room and placed on the operating room table in supine position with the Right upper extremity on an armboard.  Bier block and sedation was induced by Anesthesiology.  Right upper extremity was prepped and draped in normal sterile orthopaedic fashion.  A surgical pause was performed between the surgeons,  anesthesia, and operating room staff, and all were in agreement as to the patient, procedure, and site of procedure.  Tourniquet at the proximal aspect of the forearm  had been inflated for the Bier block.  Incision was made over the transverse carpal ligament and carried into the subcutaneous tissues by spreading technique.  Bipolar electrocautery was used to obtain hemostasis.   The surgical field was injected with 0.25% plain Marcaine to aid in operative anesthesia.  The palmar fascia was sharply incised.  The transverse carpal ligament was identified and sharply incised.  It was incised distally first.  Care was taken to ensure complete decompression distally.  It was then incised proximally.  Scissors were used to split the distal aspect of the volar antebrachial fascia.  A finger was placed into the wound to ensure complete decompression, which was the case.  The nerve was examined.  It was adherent to the radial leaflet.  The motor branch was difficult to locate, but was identified and was intact.  The wound was copiously irrigated with sterile saline.  It was then closed with 4-0 nylon in a horizontal mattress fashion. It was dressed with sterile Xeroform, 4x4s, an ABD, and wrapped with Kerlix and an Ace bandage.  Tourniquet was deflated at 30 minutes.  Fingertips were pink with brisk capillary refill after deflation of the tourniquet.  Operative drapes were broken down.  The patient was awoken from anesthesia safely.  She was transferred back to stretcher and taken to the PACU in stable  condition.  I will see her back in the office in 1 week for postoperative followup.  I will give her a prescription for norco 5/325 1-2 tabs PO q6 hours prn pain, dispense #20.    Tami Ribas, MD Electronically signed, 04/28/16

## 2016-04-29 ENCOUNTER — Encounter (HOSPITAL_BASED_OUTPATIENT_CLINIC_OR_DEPARTMENT_OTHER): Payer: Self-pay | Admitting: Orthopedic Surgery

## 2016-05-13 ENCOUNTER — Other Ambulatory Visit: Payer: Self-pay | Admitting: Orthopedic Surgery

## 2016-05-17 NOTE — Addendum Note (Signed)
Addended by: Betha Loa on: 05/17/2016 01:32 PM   Modules accepted: Orders

## 2016-06-08 ENCOUNTER — Encounter (HOSPITAL_BASED_OUTPATIENT_CLINIC_OR_DEPARTMENT_OTHER): Payer: Self-pay | Admitting: *Deleted

## 2016-06-16 ENCOUNTER — Ambulatory Visit (HOSPITAL_BASED_OUTPATIENT_CLINIC_OR_DEPARTMENT_OTHER): Payer: Medicaid Other | Admitting: Anesthesiology

## 2016-06-16 ENCOUNTER — Encounter (HOSPITAL_BASED_OUTPATIENT_CLINIC_OR_DEPARTMENT_OTHER): Payer: Self-pay

## 2016-06-16 ENCOUNTER — Ambulatory Visit (HOSPITAL_BASED_OUTPATIENT_CLINIC_OR_DEPARTMENT_OTHER)
Admission: RE | Admit: 2016-06-16 | Discharge: 2016-06-16 | Disposition: A | Discharge status: Home / Self Care | Payer: Medicaid Other | Source: Ambulatory Visit | Attending: Orthopedic Surgery | Admitting: Orthopedic Surgery

## 2016-06-16 ENCOUNTER — Encounter (HOSPITAL_BASED_OUTPATIENT_CLINIC_OR_DEPARTMENT_OTHER)
Admission: RE | Disposition: A | Discharge status: Home / Self Care | Payer: Self-pay | Source: Ambulatory Visit | Attending: Orthopedic Surgery

## 2016-06-16 DIAGNOSIS — Z87891 Personal history of nicotine dependence: Secondary | ICD-10-CM | POA: Diagnosis not present

## 2016-06-16 DIAGNOSIS — Z79899 Other long term (current) drug therapy: Secondary | ICD-10-CM | POA: Diagnosis not present

## 2016-06-16 DIAGNOSIS — G5602 Carpal tunnel syndrome, left upper limb: Secondary | ICD-10-CM | POA: Diagnosis not present

## 2016-06-16 DIAGNOSIS — K219 Gastro-esophageal reflux disease without esophagitis: Secondary | ICD-10-CM | POA: Insufficient documentation

## 2016-06-16 DIAGNOSIS — F329 Major depressive disorder, single episode, unspecified: Secondary | ICD-10-CM | POA: Insufficient documentation

## 2016-06-16 DIAGNOSIS — Z880 Allergy status to penicillin: Secondary | ICD-10-CM | POA: Diagnosis not present

## 2016-06-16 DIAGNOSIS — Z6841 Body Mass Index (BMI) 40.0 and over, adult: Secondary | ICD-10-CM | POA: Insufficient documentation

## 2016-06-16 DIAGNOSIS — F419 Anxiety disorder, unspecified: Secondary | ICD-10-CM | POA: Insufficient documentation

## 2016-06-16 HISTORY — DX: Headache, unspecified: R51.9

## 2016-06-16 HISTORY — DX: Headache: R51

## 2016-06-16 HISTORY — PX: CARPAL TUNNEL RELEASE: SHX101

## 2016-06-16 SURGERY — CARPAL TUNNEL RELEASE
Anesthesia: Regional | Site: Wrist | Laterality: Left

## 2016-06-16 MED ORDER — FENTANYL CITRATE (PF) 100 MCG/2ML IJ SOLN: 25.0000 ug | INTRAMUSCULAR | Status: DC | PRN

## 2016-06-16 MED ORDER — CLINDAMYCIN PHOSPHATE 900 MG/50ML IV SOLN
900.0000 mg | INTRAVENOUS | Status: AC
  Administered 2016-06-16: 900 mg via INTRAVENOUS

## 2016-06-16 MED ORDER — PROMETHAZINE HCL 25 MG/ML IJ SOLN: 6.2500 mg | INTRAMUSCULAR | Status: DC | PRN

## 2016-06-16 MED ORDER — FENTANYL CITRATE (PF) 100 MCG/2ML IJ SOLN
50.0000 ug | INTRAMUSCULAR | Status: DC | PRN
  Administered 2016-06-16: 50 ug via INTRAVENOUS

## 2016-06-16 MED ORDER — PROPOFOL 500 MG/50ML IV EMUL
INTRAVENOUS | Status: DC | PRN
  Administered 2016-06-16: 100 ug/kg/min via INTRAVENOUS

## 2016-06-16 MED ORDER — FENTANYL CITRATE (PF) 100 MCG/2ML IJ SOLN
INTRAMUSCULAR | Status: AC
  Filled 2016-06-16: qty 2

## 2016-06-16 MED ORDER — MIDAZOLAM HCL 2 MG/2ML IJ SOLN
1.0000 mg | INTRAMUSCULAR | Status: DC | PRN
  Administered 2016-06-16: 1 mg via INTRAVENOUS

## 2016-06-16 MED ORDER — LIDOCAINE HCL (PF) 0.5 % IJ SOLN
INTRAMUSCULAR | Status: DC | PRN
  Administered 2016-06-16: 35 mL via INTRAVENOUS

## 2016-06-16 MED ORDER — LACTATED RINGERS IV SOLN
INTRAVENOUS | Status: DC
  Administered 2016-06-16: 08:00:00 via INTRAVENOUS

## 2016-06-16 MED ORDER — SCOPOLAMINE 1 MG/3DAYS TD PT72: 1.0000 | MEDICATED_PATCH | Freq: Once | TRANSDERMAL | Status: DC | PRN

## 2016-06-16 MED ORDER — ONDANSETRON HCL 4 MG/2ML IJ SOLN
INTRAMUSCULAR | Status: DC | PRN
  Administered 2016-06-16: 4 mg via INTRAVENOUS

## 2016-06-16 MED ORDER — HYDROCODONE-ACETAMINOPHEN 5-325 MG PO TABS: ORAL_TABLET | ORAL | 0 refills | Status: DC

## 2016-06-16 MED ORDER — MIDAZOLAM HCL 2 MG/2ML IJ SOLN
INTRAMUSCULAR | Status: AC
  Filled 2016-06-16: qty 2

## 2016-06-16 MED ORDER — CLINDAMYCIN PHOSPHATE 900 MG/50ML IV SOLN
INTRAVENOUS | Status: AC
  Filled 2016-06-16: qty 50

## 2016-06-16 MED ORDER — ONDANSETRON HCL 4 MG/2ML IJ SOLN
INTRAMUSCULAR | Status: AC
  Filled 2016-06-16: qty 2

## 2016-06-16 MED ORDER — BUPIVACAINE HCL (PF) 0.25 % IJ SOLN
INTRAMUSCULAR | Status: DC | PRN
  Administered 2016-06-16: 10 mL

## 2016-06-16 MED ORDER — CHLORHEXIDINE GLUCONATE 4 % EX LIQD: 60.0000 mL | Freq: Once | CUTANEOUS | Status: DC

## 2016-06-16 SURGICAL SUPPLY — 39 items
BANDAGE ACE 3X5.8 VEL STRL LF (GAUZE/BANDAGES/DRESSINGS) ×3 IMPLANT
BLADE SURG 15 STRL LF DISP TIS (BLADE) ×2 IMPLANT
BLADE SURG 15 STRL SS (BLADE) ×6
BNDG CMPR 9X4 STRL LF SNTH (GAUZE/BANDAGES/DRESSINGS) ×1
BNDG ESMARK 4X9 LF (GAUZE/BANDAGES/DRESSINGS) ×2 IMPLANT
BNDG GAUZE ELAST 4 BULKY (GAUZE/BANDAGES/DRESSINGS) ×3 IMPLANT
CHLORAPREP W/TINT 26ML (MISCELLANEOUS) ×3 IMPLANT
CORDS BIPOLAR (ELECTRODE) ×3 IMPLANT
COVER BACK TABLE 60X90IN (DRAPES) ×3 IMPLANT
COVER MAYO STAND STRL (DRAPES) ×3 IMPLANT
CUFF TOURN SGL LL 18 NRW (TOURNIQUET CUFF) ×2 IMPLANT
CUFF TOURNIQUET SINGLE 18IN (TOURNIQUET CUFF) ×1 IMPLANT
DRAPE EXTREMITY T 121X128X90 (DRAPE) ×3 IMPLANT
DRAPE SURG 17X23 STRL (DRAPES) ×3 IMPLANT
DRSG PAD ABDOMINAL 8X10 ST (GAUZE/BANDAGES/DRESSINGS) ×3 IMPLANT
GAUZE SPONGE 4X4 12PLY STRL (GAUZE/BANDAGES/DRESSINGS) ×3 IMPLANT
GAUZE XEROFORM 1X8 LF (GAUZE/BANDAGES/DRESSINGS) ×3 IMPLANT
GLOVE BIO SURGEON STRL SZ 6.5 (GLOVE) ×3 IMPLANT
GLOVE BIO SURGEON STRL SZ7.5 (GLOVE) ×5 IMPLANT
GLOVE BIO SURGEONS STRL SZ 6.5 (GLOVE) ×3
GLOVE BIOGEL PI IND STRL 7.0 (GLOVE) IMPLANT
GLOVE BIOGEL PI IND STRL 8 (GLOVE) ×1 IMPLANT
GLOVE BIOGEL PI INDICATOR 7.0 (GLOVE) ×4
GLOVE BIOGEL PI INDICATOR 8 (GLOVE) ×2
GOWN STRL REUS W/ TWL LRG LVL3 (GOWN DISPOSABLE) ×1 IMPLANT
GOWN STRL REUS W/TWL LRG LVL3 (GOWN DISPOSABLE) ×6
GOWN STRL REUS W/TWL XL LVL3 (GOWN DISPOSABLE) ×3 IMPLANT
NDL HYPO 25X1 1.5 SAFETY (NEEDLE) ×1 IMPLANT
NEEDLE HYPO 25X1 1.5 SAFETY (NEEDLE) ×3 IMPLANT
NS IRRIG 1000ML POUR BTL (IV SOLUTION) ×3 IMPLANT
PACK BASIN DAY SURGERY FS (CUSTOM PROCEDURE TRAY) ×3 IMPLANT
PADDING CAST ABS 4INX4YD NS (CAST SUPPLIES) ×2
PADDING CAST ABS COTTON 4X4 ST (CAST SUPPLIES) ×1 IMPLANT
STOCKINETTE 4X48 STRL (DRAPES) ×3 IMPLANT
SUT ETHILON 4 0 PS 2 18 (SUTURE) ×3 IMPLANT
SYR BULB 3OZ (MISCELLANEOUS) ×3 IMPLANT
SYR CONTROL 10ML LL (SYRINGE) ×3 IMPLANT
TOWEL OR 17X24 6PK STRL BLUE (TOWEL DISPOSABLE) ×6 IMPLANT
UNDERPAD 30X30 (UNDERPADS AND DIAPERS) ×3 IMPLANT

## 2016-06-16 NOTE — Brief Op Note (Signed)
06/16/2016  9:22 AM  PATIENT:  Ernestene MentionAmanda Looman  41 y.o. female  PRE-OPERATIVE DIAGNOSIS:  LEFT CARPAL TUNNEL SYNDROME  POST-OPERATIVE DIAGNOSIS:  LEFT CARPAL TUNNEL SYNDROME  PROCEDURE:  Procedure(s): LEFT CARPAL TUNNEL RELEASE (Left)  SURGEON:  Surgeon(s) and Role:    Betha Loa* Cassundra Mckeever, MD - Primary  PHYSICIAN ASSISTANT:   ASSISTANTS: none   ANESTHESIA:   Bier block with sedation  EBL:  No intake/output data recorded.  BLOOD ADMINISTERED:none  DRAINS: none   LOCAL MEDICATIONS USED:  MARCAINE     SPECIMEN:  No Specimen  DISPOSITION OF SPECIMEN:  N/A  COUNTS:  YES  TOURNIQUET:   Total Tourniquet Time Documented: Forearm (Left) - 23 minutes Total: Forearm (Left) - 23 minutes   DICTATION: .Note written in EPIC  PLAN OF CARE: Discharge to home after PACU  PATIENT DISPOSITION:  PACU - hemodynamically stable.

## 2016-06-16 NOTE — Op Note (Signed)
06/16/2016 [facility]                              OPERATIVE REPORT   PREOPERATIVE DIAGNOSIS:  Left carpal tunnel syndrome.  POSTOPERATIVE DIAGNOSIS:  Left carpal tunnel syndrome.  PROCEDURE:  Left carpal tunnel release.  SURGEON:  Betha LoaKevin Ignacio Lowder, MD  ASSISTANT:  none.  ANESTHESIA:  Bier block and sedation.  IV FLUIDS:  Per anesthesia flow sheet.  ESTIMATED BLOOD LOSS:  Minimal.  COMPLICATIONS:  None.  SPECIMENS:  None.  TOURNIQUET TIME:    Total Tourniquet Time Documented: Forearm (Left) - 23 minutes Total: Forearm (Left) - 23 minutes   DISPOSITION:  Stable to PACU.  LOCATION: [facility]  INDICATIONS:  41 yo female with numbness and tingling in left hand.  Positive nerve conduction studies. She wishes to have a carpal tunnel release for management of her symptoms.  Risks, benefits and alternatives of surgery were discussed including the risk of blood loss; infection; damage to nerves, vessels, tendons, ligaments, bone; failure of surgery; need for additional surgery; complications with wound healing; continued pain; recurrence of carpal tunnel syndrome; and damage to motor branch. She voiced understanding of these risks and elected to proceed.   OPERATIVE COURSE:  After being identified preoperatively by myself, the patient and I agreed upon the procedure and site of procedure.  The surgical site was marked.  The risks, benefits, and alternatives of the surgery were reviewed and she wished to proceed.  Surgical consent had been signed.  She was given IV clindamycin as preoperative antibiotic prophylaxis.  She was transferred to the operating room and placed on the operating room table in supine position with the Left upper extremity on an armboard.  Bier block and sedation was induced by Anesthesiology.  Left upper extremity was prepped and draped in normal sterile orthopaedic fashion.  A surgical pause was performed between the surgeons, anesthesia,  and operating room staff, and all were in agreement as to the patient, procedure, and site of procedure.  Tourniquet at the proximal aspect of the forearm  had been inflated for the Bier block.  Incision was made over the transverse carpal ligament and carried into the subcutaneous tissues by spreading technique.  Bipolar electrocautery was used to obtain hemostasis.  The palmar fascia was sharply incised.  The transverse carpal ligament was identified and sharply incised.  It was incised distally first.  Care was taken to ensure complete decompression distally.  It was then incised proximally.  Scissors were used to split the distal aspect of the volar antebrachial fascia.  A finger was placed into the wound to ensure complete decompression, which was the case.  The nerve was examined.  It was adherent to the radial leaflet.  The motor branch was identified and was intact.  The wound was copiously irrigated with sterile saline.  It was then closed with 4-0 nylon in a horizontal mattress fashion.  It was injected with 0.25% plain Marcaine to aid in postoperative analgesia.  It was dressed with sterile Xeroform, 4x4s, an ABD, and wrapped with Kerlix and an Ace bandage.  Tourniquet was deflated at 23 minutes.  Fingertips were pink with brisk capillary refill after deflation of the tourniquet.  Operative drapes were broken down.  The patient was awoken from anesthesia safely.  She was transferred back to stretcher and taken to the PACU in stable condition.  I will see her back in  the office in 1 week for postoperative followup.  I will give her a prescription for norco 5/325 1-2 tabs PO q6 hours prn pain, dispense #20.    Tennis Must, MD Electronically signed, 06/16/16

## 2016-06-16 NOTE — Transfer of Care (Signed)
Immediate Anesthesia Transfer of Care Note  Patient: Ernestene MentionAmanda Zullo  Procedure(s) Performed: Procedure(s): LEFT CARPAL TUNNEL RELEASE (Left)  Patient Location: PACU  Anesthesia Type:Bier block  Level of Consciousness: awake, alert  and oriented  Airway & Oxygen Therapy: Patient Spontanous Breathing and Patient connected to face mask oxygen  Post-op Assessment: Report given to RN and Post -op Vital signs reviewed and stable  Post vital signs: Reviewed and stable  Last Vitals:  Vitals:   06/16/16 0740  BP: 129/81  Pulse: 87  Resp: 18  Temp: 36.9 C    Last Pain:  Vitals:   06/16/16 0740  TempSrc: Oral         Complications: No apparent anesthesia complications

## 2016-06-16 NOTE — H&P (Signed)
Daisy Hoffman is an 41 y.o. female.   Chief Complaint: left carpal tunnel syndrome HPI: 41 yo female with numbness and tingling left hand.  Positive nerve conduction studies.  She wishes to have a left carpal tunnel release.  Allergies:  Allergies  Allergen Reactions  . Omnicef [Cefdinir] Swelling    rash  . Penicillins Rash    Past Medical History:  Diagnosis Date  . Allergy   . Anemia   . Anxiety   . Depression   . GERD (gastroesophageal reflux disease)    occ, due to gastric lapband  . Headache   . LGSIL (low grade squamous intraepithelial dysplasia) 11/2005, 11/2006, 02/2007, 08/2008.,02/2008   C&B LGSIL 11/2005, C&B 05/2006 PAP LGSIL A FEW HIGHER,  . Snores     Past Surgical History:  Procedure Laterality Date  . CARPAL TUNNEL RELEASE Right 04/28/2016   Procedure: RIGHT CARPAL TUNNEL RELEASE;  Surgeon: Betha Loa, MD;  Location: [facility];  Service: Orthopedics;  Laterality: Right;  . FOOT SURGERY  1997  . INTRAUTERINE DEVICE INSERTION  10/07/2013   Mirena  . LAPAROSCOPIC GASTRIC BANDING  04/2008    Family History: Family History  Problem Relation Age of Onset  . Diabetes Father   . Hypertension Father   . Osteoporosis Mother   . Hypertension Mother     Social History:   reports that she quit smoking about 3 years ago. Her smoking use included Cigarettes. She started smoking about 4 years ago. She has a 22.00 pack-year smoking history. She has never used smokeless tobacco. She reports that she drinks alcohol. She reports that she does not use drugs.  Medications: Facility-Administered Medications Prior to Admission  Medication Dose Route Frequency Provider Last Rate Last Dose  . levonorgestrel (MIRENA) 20 MCG/24HR IUD   Intrauterine Once Fontaine, Nadyne Coombes, MD       Medications Prior to Admission  Medication Sig Dispense Refill  . buPROPion (WELLBUTRIN XL) 300 MG 24 hr tablet Take 300 mg by mouth daily.    Marland Kitchen HYDROcodone-acetaminophen  (NORCO) 5-325 MG tablet 1-2 tabs po q6 hours prn pain 20 tablet 0  . ibuprofen (ADVIL,MOTRIN) 200 MG tablet Take 200 mg by mouth every 8 (eight) hours as needed for mild pain.     Marland Kitchen omeprazole (PRILOSEC OTC) 20 MG tablet Take 20 mg by mouth daily.    Marland Kitchen venlafaxine XR (EFFEXOR-XR) 150 MG 24 hr capsule Take 150 mg by mouth daily.    Marland Kitchen ALPRAZolam (XANAX) 0.25 MG tablet Take 0.25 mg by mouth 3 (three) times daily as needed for anxiety.       No results found for this or any previous visit (from the past 48 hour(s)).  No results found.   A comprehensive review of systems was negative.  Blood pressure 129/81, pulse 87, temperature 98.4 F (36.9 C), temperature source Oral, resp. rate 18, height 5\' 2"  (1.575 m), weight 112 kg (247 lb), last menstrual period 06/01/2016, SpO2 99 %.  General appearance: alert, cooperative and appears stated age Head: Normocephalic, without obvious abnormality, atraumatic Neck: supple, symmetrical, trachea midline Resp: clear to auscultation bilaterally Cardio: regular rate and rhythm GI: non-tender Extremities: Intact sensation and capillary refill all digits.  +epl/fpl/io.  No wounds.  Pulses: 2+ and symmetric Skin: Skin color, texture, turgor normal. No rashes or lesions Neurologic: Grossly normal Incision/Wound:none  Assessment/Plan Left carpal tunnel release.  Non operative and operative treatment options were discussed with the patient and patient wishes to proceed with operative  treatment. Risks, benefits, and alternatives of surgery were discussed and the patient agrees with the plan of care.   Siniya Lichty R 06/16/2016, 8:35 AM

## 2016-06-16 NOTE — Discharge Instructions (Addendum)

## 2016-06-16 NOTE — Anesthesia Preprocedure Evaluation (Signed)
Anesthesia Evaluation  Patient identified by MRN, date of birth, ID band Patient awake    Reviewed: Allergy & Precautions, NPO status , Patient's Chart, lab work & pertinent test results  Airway Mallampati: I  TM Distance: >3 FB Neck ROM: Full    Dental  (+) Teeth Intact, Dental Advisory Given   Pulmonary former smoker,    Pulmonary exam normal breath sounds clear to auscultation       Cardiovascular Normal cardiovascular exam Rhythm:Regular Rate:Normal     Neuro/Psych  Headaches, PSYCHIATRIC DISORDERS Anxiety Depression    GI/Hepatic GERD  Medicated and Controlled,S/p lap band   Endo/Other  Morbid obesity  Renal/GU      Musculoskeletal   Abdominal   Peds  Hematology  (+) Blood dyscrasia, anemia ,   Anesthesia Other Findings   Reproductive/Obstetrics                             Anesthesia Physical  Anesthesia Plan  ASA: III  Anesthesia Plan: Bier Block   Post-op Pain Management:    Induction: Intravenous  Airway Management Planned: Simple Face Mask  Additional Equipment:   Intra-op Plan:   Post-operative Plan:   Informed Consent: I have reviewed the patients History and Physical, chart, labs and discussed the procedure including the risks, benefits and alternatives for the proposed anesthesia with the patient or authorized representative who has indicated his/her understanding and acceptance.   Dental advisory given  Plan Discussed with: CRNA and Surgeon  Anesthesia Plan Comments:         Anesthesia Quick Evaluation

## 2016-06-16 NOTE — Anesthesia Postprocedure Evaluation (Signed)
Anesthesia Post Note  Patient: Daisy Hoffman  Procedure(s) Performed: Procedure(s) (LRB): LEFT CARPAL TUNNEL RELEASE (Left)  Patient location during evaluation: PACU Anesthesia Type: Bier Block Level of consciousness: awake and alert Pain management: pain level controlled Vital Signs Assessment: post-procedure vital signs reviewed and stable Respiratory status: spontaneous breathing, nonlabored ventilation, respiratory function stable and patient connected to nasal cannula oxygen Cardiovascular status: stable and blood pressure returned to baseline Anesthetic complications: no       Last Vitals:  Vitals:   06/16/16 0943 06/16/16 1001  BP: 131/80 127/88  Pulse: 89 85  Resp: (!) 25 16  Temp:  36.8 C    Last Pain:  Vitals:   06/16/16 1001  TempSrc: Oral  PainSc:                  Cecile HearingStephen Edward Turk

## 2016-06-17 ENCOUNTER — Encounter (HOSPITAL_BASED_OUTPATIENT_CLINIC_OR_DEPARTMENT_OTHER): Payer: Self-pay | Admitting: Orthopedic Surgery

## 2016-12-08 ENCOUNTER — Emergency Department (HOSPITAL_BASED_OUTPATIENT_CLINIC_OR_DEPARTMENT_OTHER)
Admission: EM | Admit: 2016-12-08 | Discharge: 2016-12-08 | Disposition: A | Discharge status: Home / Self Care | Payer: Medicaid Other | Attending: Physician Assistant | Admitting: Physician Assistant

## 2016-12-08 ENCOUNTER — Emergency Department (HOSPITAL_BASED_OUTPATIENT_CLINIC_OR_DEPARTMENT_OTHER): Payer: Medicaid Other

## 2016-12-08 ENCOUNTER — Encounter (HOSPITAL_BASED_OUTPATIENT_CLINIC_OR_DEPARTMENT_OTHER): Payer: Self-pay

## 2016-12-08 ENCOUNTER — Other Ambulatory Visit: Payer: Self-pay

## 2016-12-08 DIAGNOSIS — Z79899 Other long term (current) drug therapy: Secondary | ICD-10-CM | POA: Insufficient documentation

## 2016-12-08 DIAGNOSIS — Y929 Unspecified place or not applicable: Secondary | ICD-10-CM | POA: Diagnosis not present

## 2016-12-08 DIAGNOSIS — W5503XA Scratched by cat, initial encounter: Secondary | ICD-10-CM | POA: Diagnosis not present

## 2016-12-08 DIAGNOSIS — Y999 Unspecified external cause status: Secondary | ICD-10-CM | POA: Diagnosis not present

## 2016-12-08 DIAGNOSIS — Y939 Activity, unspecified: Secondary | ICD-10-CM | POA: Insufficient documentation

## 2016-12-08 DIAGNOSIS — S50811A Abrasion of right forearm, initial encounter: Secondary | ICD-10-CM | POA: Insufficient documentation

## 2016-12-08 DIAGNOSIS — Z87891 Personal history of nicotine dependence: Secondary | ICD-10-CM | POA: Insufficient documentation

## 2016-12-08 DIAGNOSIS — W5501XA Bitten by cat, initial encounter: Secondary | ICD-10-CM

## 2016-12-08 LAB — CBC WITH DIFFERENTIAL/PLATELET
BASOS ABS: 0 10*3/uL (ref 0.0–0.1)
BASOS PCT: 0 %
Eosinophils Absolute: 0.2 10*3/uL (ref 0.0–0.7)
Eosinophils Relative: 2 %
HEMATOCRIT: 38.8 % (ref 36.0–46.0)
HEMOGLOBIN: 12.7 g/dL (ref 12.0–15.0)
Lymphocytes Relative: 25 %
Lymphs Abs: 1.8 10*3/uL (ref 0.7–4.0)
MCH: 27.5 pg (ref 26.0–34.0)
MCHC: 32.7 g/dL (ref 30.0–36.0)
MCV: 84 fL (ref 78.0–100.0)
Monocytes Absolute: 0.5 10*3/uL (ref 0.1–1.0)
Monocytes Relative: 6 %
NEUTROS PCT: 67 %
Neutro Abs: 5 10*3/uL (ref 1.7–7.7)
Platelets: 246 10*3/uL (ref 150–400)
RBC: 4.62 MIL/uL (ref 3.87–5.11)
RDW: 13.2 % (ref 11.5–15.5)
WBC: 7.5 10*3/uL (ref 4.0–10.5)

## 2016-12-08 LAB — BASIC METABOLIC PANEL
Anion gap: 7 (ref 5–15)
BUN: 11 mg/dL (ref 6–20)
CHLORIDE: 102 mmol/L (ref 101–111)
CO2: 26 mmol/L (ref 22–32)
CREATININE: 0.74 mg/dL (ref 0.44–1.00)
Calcium: 8.9 mg/dL (ref 8.9–10.3)
GFR calc non Af Amer: >60 mL/min (ref >60)
GLUCOSE: 96 mg/dL (ref 65–99)
Potassium: 3.6 mmol/L (ref 3.5–5.1)
Sodium: 135 mmol/L (ref 135–145)

## 2016-12-08 MED ORDER — DOXYCYCLINE HYCLATE 100 MG PO TABS
100.0000 mg | ORAL_TABLET | Freq: Once | ORAL | Status: AC
  Administered 2016-12-08: 100 mg via ORAL
  Filled 2016-12-08: qty 1

## 2016-12-08 MED ORDER — HYDROCODONE-ACETAMINOPHEN 5-325 MG PO TABS: 2.0000 | ORAL_TABLET | ORAL | 0 refills | Status: DC | PRN

## 2016-12-08 MED ORDER — DOXYCYCLINE HYCLATE 100 MG IV SOLR
100.0000 mg | Freq: Once | INTRAVENOUS | Status: DC
  Filled 2016-12-08: qty 100

## 2016-12-08 MED ORDER — DOXYCYCLINE HYCLATE 100 MG PO CAPS: 100.0000 mg | ORAL_CAPSULE | Freq: Two times a day (BID) | ORAL | 0 refills | Status: DC

## 2016-12-08 MED ORDER — CLINDAMYCIN HCL 300 MG PO CAPS: 300.0000 mg | ORAL_CAPSULE | Freq: Three times a day (TID) | ORAL | 0 refills | Status: DC

## 2016-12-08 MED ORDER — CLINDAMYCIN PHOSPHATE 600 MG/50ML IV SOLN
600.0000 mg | Freq: Once | INTRAVENOUS | Status: AC
Start: 2016-12-08 — End: 2016-12-08
  Administered 2016-12-08: 600 mg via INTRAVENOUS
  Filled 2016-12-08: qty 50

## 2016-12-08 MED ORDER — TETANUS-DIPHTH-ACELL PERTUSSIS 5-2.5-18.5 LF-MCG/0.5 IM SUSP: 0.5000 mL | Freq: Once | INTRAMUSCULAR | Status: DC

## 2016-12-08 NOTE — ED Triage Notes (Signed)
Pt states her cat bit and scratched her right UE yesterday-NAD-steady gait

## 2016-12-08 NOTE — ED Notes (Signed)
Pt reports her cat was frightened by a dog yesterday evening and grabbed onto her arm and bit her hand and scratched her forearm. UTD on rabies vaccine. Pt reports today her hand is swollen and painful to move

## 2016-12-08 NOTE — ED Provider Notes (Signed)
[facility] EMERGENCY DEPARTMENT Provider Note   CSN: [id number] Arrival date & time: 12/08/16  1731     History   Chief Complaint Chief Complaint  Patient presents with  . Animal Bite    HPI Daisy Hoffman is a 41 y.o. female.  HPI 41 year old Caucasian female with no pertinent past medical history presents to the ED with complaints of Bite and scratch to her right hand and forearm.  Patient states that last night she tried to come for her cat after she was attacked by dog.  States that the cat bit her on her right hand and scratched her right forearm.  States that she cleaned it out however today the redness and swelling worsened.  She also reports pain with range of motion of the right hand.  Denies any associated fevers, chills, nausea, vomiting.  Patient not taking for the pain prior to arrival.  Nothing makes better.  Patient is unsure of her tetanus shot is up-to-date.  She does state that the cat's rabies shots are up-to-date.  Denies any associated paresthesias or weakness. Past Medical History:  Diagnosis Date  . Allergy   . Anemia   . Anxiety   . Depression   . GERD (gastroesophageal reflux disease)    occ, due to gastric lapband  . Headache   . LGSIL (low grade squamous intraepithelial dysplasia) 11/2005, 11/2006, 02/2007, 08/2008.,02/2008   C&B LGSIL 11/2005, C&B 05/2006 PAP LGSIL A FEW HIGHER,  . Snores     Patient Active Problem List   Diagnosis Date Noted  . [facility] April 2010 10/11/2012  . Depression   . Anxiety   . IUD   . OBESITY 04/20/2006  . TOBACCO ABUSE 04/20/2006    Past Surgical History:  Procedure Laterality Date  . CARPAL TUNNEL RELEASE Right 04/28/2016   Procedure: RIGHT CARPAL TUNNEL RELEASE;  Surgeon: Betha LoaKevin Kuzma, MD;  Location: [facility];  Service: Orthopedics;  Laterality: Right;  . CARPAL TUNNEL RELEASE Left 06/16/2016   Procedure: LEFT CARPAL TUNNEL RELEASE;  Surgeon: Betha LoaKuzma, Kevin, MD;   Location: [facility];  Service: Orthopedics;  Laterality: Left;  . FOOT SURGERY  1997  . INTRAUTERINE DEVICE INSERTION  10/07/2013   Mirena  . LAPAROSCOPIC GASTRIC BANDING  04/2008    OB History    Gravida Para Term Preterm AB Living   2 1 1   1 1    SAB TAB Ectopic Multiple Live Births                   Home Medications    Prior to Admission medications   Medication Sig Start Date End Date Taking? Authorizing Provider  lisdexamfetamine (VYVANSE) 40 MG capsule Take 40 mg by mouth every morning.   Yes [provider]  buPROPion (WELLBUTRIN XL) 300 MG 24 hr tablet Take 300 mg by mouth daily.    [provider]  ibuprofen (ADVIL,MOTRIN) 200 MG tablet Take 200 mg by mouth every 8 (eight) hours as needed for mild pain.     [provider]  omeprazole (PRILOSEC OTC) 20 MG tablet Take 20 mg by mouth daily.    [provider]  venlafaxine XR (EFFEXOR-XR) 150 MG 24 hr capsule Take 150 mg by mouth daily.    [provider]    Family History Family History  Problem Relation Age of Onset  . Diabetes Father   . Hypertension Father   . Osteoporosis Mother   .  Hypertension Mother     Social History Social History   Tobacco Use  . Smoking status: Former Smoker    Packs/day: 1.00    Years: 22.00    Pack years: 22.00    Types: Cigarettes    Start date: 01/25/2012    Last attempt to quit: 07/27/2012    Years since quitting: 4.3  . Smokeless tobacco: Never Used  . Tobacco comment: vapes with nicotene daily  Substance Use Topics  . Alcohol use: Yes    Comment: rarely  . Drug use: No     Allergies   Omnicef [cefdinir] and Penicillins   Review of Systems Review of Systems  Constitutional: Negative for chills and fever.  Gastrointestinal: Negative for nausea and vomiting.  Musculoskeletal: Positive for arthralgias, joint swelling and myalgias.  Skin: Positive for color change and wound.  Neurological: Negative for  weakness and numbness.     Physical Exam Updated Vital Signs BP (!) 140/95 (BP Location: Left Arm)   Pulse 95   Temp 98.8 F (37.1 C) (Oral)   Resp 18   Ht 5\' 2"  (1.575 m)   Wt 110.7 kg (244 lb)   SpO2 100%   BMI 44.63 kg/m   Physical Exam  Constitutional: She appears well-developed and well-nourished. No distress.  HENT:  Head: Normocephalic and atraumatic.  Eyes: Right eye exhibits no discharge. Left eye exhibits no discharge. No scleral icterus.  Neck: Normal range of motion.  Cardiovascular: Intact distal pulses.  Pulmonary/Chest: No respiratory distress.  Musculoskeletal: Normal range of motion.  Sensation intact in all dermatomes.  Brisk cap refill.  Radial pulses 2+ bilaterally.  Limited grip strength the right hand due to swelling and pain.  Neurological: She is alert.  Skin: Skin is warm and dry. Capillary refill takes less than 2 seconds. There is erythema. No pallor.  Edema and erythema noted to the dorsum of the right hand.  Patient also has a puncture wound to the palmar aspect over the second MCP joint of the right hand.  Patient is holding her hand in flexion however with passive flexion of her fingers the pain is associated with the dorsum of the hand and not over the flexor tendon.  Patient does have scratches noted to the right forearm bleeding controlled and no signs of streaking at this time.  There is no purulent drainage.  No open wound.  Psychiatric: Her behavior is normal. Judgment and thought content normal.  Nursing note and vitals reviewed.            ED Treatments / Results  Labs (all labs ordered are listed, but only abnormal results are displayed) Labs Reviewed  BASIC METABOLIC PANEL  CBC WITH DIFFERENTIAL/PLATELET    EKG  EKG Interpretation None       Radiology Dg Forearm Right  Result Date: 12/08/2016 CLINICAL DATA:  Cat bite to the right hand and forearm. Redness and swelling. EXAM: RIGHT FOREARM - 2 VIEW COMPARISON:   None. FINDINGS: There is no evidence of fracture or other focal bone lesions. Soft tissues are unremarkable. IMPRESSION: Negative radiographs of the right forearm. Electronically Signed   By: Rubye Oaks M.D.   On: 12/08/2016 18:48   Dg Hand Complete Right  Result Date: 12/08/2016 CLINICAL DATA:  Cat bite to the right hand and forearm. Redness and swelling. EXAM: RIGHT HAND - COMPLETE 3+ VIEW COMPARISON:  None. FINDINGS: There is no evidence of fracture or dislocation. Mild cortical thickening of the medial aspect of the index  finger proximal phalanx is chronic and of doubtful clinical significance. No radiopaque foreign body or soft tissue air. Probable soft tissue edema about the dorsal hand. IMPRESSION: Soft tissue edema without acute osseous abnormality or soft tissue air. Electronically Signed   By: Rubye OaksMelanie  Ehinger M.D.   On: 12/08/2016 18:47    Procedures Procedures (including critical care time)  Medications Ordered in ED Medications  clindamycin (CLEOCIN) IVPB 600 mg (0 mg Intravenous Stopped 12/08/16 1935)  doxycycline (VIBRA-TABS) tablet 100 mg (100 mg Oral Given 12/08/16 1851)     Initial Impression / Assessment and Plan / ED Course  I have reviewed the triage vital signs and the nursing notes.  Pertinent labs & imaging results that were available during my care of the patient were reviewed by me and considered in my medical decision making (see chart for details).     Patient presents to the emergency department with complaints of a Bite to the right hand and cat scratch.  This happened yesterday.  Cat is patient's who is up-to-date on rabies vaccinations.  Patient is unsure of her tetanus is up-to-date.  Patient reports worsening swelling and redness and pain that started today.  Patient denies any associated fever, chills, nausea, vomiting.  Patient is overall well-appearing and nontoxic.  Vital signs are reassuring.  On exam patient does have significant redness and  swelling over the dorsum of the right hand.  She has no pain with palpation or flexion of the palmar aspect of the hand.  Low suspicion for flexor tendon synovitis.  No purulent drainage noted.  X-ray reveals no retained foreign body or bony involvement.  No open lacerations that require closure.  Basic labs were obtained that were unremarkable.  No leukocytosis.  Patient is afebrile.  Offered patient admission for IV antibiotics and she states that she would like to try a trial of oral antibiotics in the outpatient setting.  Patient given 1 dose of IV antibiotics in the ED.  She does have an allergy to Augmentin.  Will start patient on clindamycin and Doxy.  Wound was cleaned in the ED.  Patient will need follow-up in 2 days.  Have also given her follow-up with hand surgery.  Pt is hemodynamically stable, in NAD, & able to ambulate in the ED. Evaluation does not show pathology that would require ongoing emergent intervention or inpatient treatment. I explained the diagnosis to the patient. Pain has been managed & has no complaints prior to dc. Pt is comfortable with above plan and is stable for discharge at this time. All questions were answered prior to disposition. Strict return precautions for f/u to the ED were discussed. Encouraged follow up with PCP.  Patient left before tetanus shot was administered.  To call patient back and she states that she will come back in 2 days for reassessment of the hand and will obtain a tetanus shot at that time.  Pt dicussed with Dr. Juliann ParesMackeun who is agreeable with the above plan.   Final Clinical Impressions(s) / ED Diagnoses   Final diagnoses:  Cat bite, initial encounter    ED Discharge Orders        Ordered    clindamycin (CLEOCIN) 300 MG capsule  3 times daily     12/08/16 1951    doxycycline (VIBRAMYCIN) 100 MG capsule  2 times daily     12/08/16 1951    HYDROcodone-acetaminophen (NORCO/VICODIN) 5-325 MG tablet  Every 4 hours PRN     12/08/16 1951  Rise Mu, PA-C 12/08/16 2330    Abelino Derrick, MD 12/09/16 2252

## 2016-12-08 NOTE — Discharge Instructions (Signed)
X-ray shows no signs of fractures or retained foreign body.  Have started you on 2 different antibiotics please take in their completion.  Continue taking Motrin.  Have given you short course of pain medicine to take at night.  It is very important that you follow-up in 2 days for recheck.  Return sooner if the redness, swelling or pain worsens.  If you develop any streaking up your arm return to the ED.  It is also very important that you follow-up with a hand doctor.  Please call tomorrow or Monday for an appointment.

## 2017-03-01 ENCOUNTER — Other Ambulatory Visit: Payer: Self-pay | Admitting: Family Medicine

## 2017-03-06 ENCOUNTER — Other Ambulatory Visit: Payer: Self-pay | Admitting: Family Medicine

## 2018-03-08 ENCOUNTER — Encounter (HOSPITAL_BASED_OUTPATIENT_CLINIC_OR_DEPARTMENT_OTHER): Payer: Self-pay | Admitting: *Deleted

## 2018-03-08 ENCOUNTER — Other Ambulatory Visit: Payer: Self-pay

## 2018-03-08 ENCOUNTER — Emergency Department (HOSPITAL_BASED_OUTPATIENT_CLINIC_OR_DEPARTMENT_OTHER): Payer: Medicaid Other

## 2018-03-08 ENCOUNTER — Emergency Department (HOSPITAL_BASED_OUTPATIENT_CLINIC_OR_DEPARTMENT_OTHER)
Admission: EM | Admit: 2018-03-08 | Discharge: 2018-03-08 | Disposition: A | Discharge status: Home / Self Care | Payer: Medicaid Other | Attending: Emergency Medicine | Admitting: Emergency Medicine

## 2018-03-08 DIAGNOSIS — M79662 Pain in left lower leg: Secondary | ICD-10-CM | POA: Insufficient documentation

## 2018-03-08 DIAGNOSIS — M79605 Pain in left leg: Secondary | ICD-10-CM

## 2018-03-08 DIAGNOSIS — Z79899 Other long term (current) drug therapy: Secondary | ICD-10-CM | POA: Insufficient documentation

## 2018-03-08 DIAGNOSIS — Z87891 Personal history of nicotine dependence: Secondary | ICD-10-CM | POA: Insufficient documentation

## 2018-03-08 NOTE — ED Triage Notes (Signed)
Pain in her left leg behind her knee. Pain started after having the flu and being in the bed for a week.

## 2018-03-08 NOTE — ED Provider Notes (Signed)
[facility] EMERGENCY DEPARTMENT Provider Note   CSN: [id number] Arrival date & time: 03/08/18  1557    History   Chief Complaint Chief Complaint  Patient presents with  . Leg Pain    HPI Daisy Hoffman is a 43 y.o. female.     The history is provided by the patient.  Leg Pain  Location:  Leg Leg location:  L lower leg Pain details:    Quality:  Aching and dull   Radiates to:  Does not radiate   Severity:  Mild   Onset quality:  Gradual   Timing:  Intermittent   Progression:  Waxing and waning Chronicity:  New Relieved by:  Nothing Worsened by:  Nothing Associated symptoms: no back pain, no decreased ROM, no fatigue, no fever, no itching, no muscle weakness, no neck pain, no numbness, no stiffness, no swelling and no tingling     Past Medical History:  Diagnosis Date  . Allergy   . Anemia   . Anxiety   . Depression   . GERD (gastroesophageal reflux disease)    occ, due to gastric lapband  . Headache   . LGSIL (low grade squamous intraepithelial dysplasia) 11/2005, 11/2006, 02/2007, 08/2008.,02/2008   C&B LGSIL 11/2005, C&B 05/2006 PAP LGSIL A FEW HIGHER,  . Snores     Patient Active Problem List   Diagnosis Date Noted  . [facility] 10/11/2012  . Depression   . Anxiety   . IUD   . OBESITY 04/20/2006  . TOBACCO ABUSE 04/20/2006    Past Surgical History:  Procedure Laterality Date  . CARPAL TUNNEL RELEASE Right 04/28/2016   Procedure: RIGHT CARPAL TUNNEL RELEASE;  Surgeon: Betha LoaKevin Kuzma, MD;  Location: [facility];  Service: Orthopedics;  Laterality: Right;  . CARPAL TUNNEL RELEASE Left 06/16/2016   Procedure: LEFT CARPAL TUNNEL RELEASE;  Surgeon: Betha LoaKuzma, Kevin, MD;  Location: [facility];  Service: Orthopedics;  Laterality: Left;  . FOOT SURGERY  1997  . INTRAUTERINE DEVICE INSERTION  10/07/2013   Mirena  . LAPAROSCOPIC GASTRIC BANDING  04/2008     OB History    Gravida  2   Para  1   Term  1   Preterm      AB  1   Living  1     SAB      TAB      Ectopic      Multiple      Live Births               Home Medications    Prior to Admission medications   Medication Sig Start Date End Date Taking? Authorizing Provider  buPROPion (WELLBUTRIN XL) 300 MG 24 hr tablet Take 300 mg by mouth daily.    [provider]  clindamycin (CLEOCIN) 300 MG capsule Take 1 capsule (300 mg total) by mouth 3 (three) times daily. 12/08/16   Rise MuLeaphart, Kenneth T, PA-C  doxycycline (VIBRAMYCIN) 100 MG capsule Take 1 capsule (100 mg total) by mouth 2 (two) times daily. 12/08/16   Rise MuLeaphart, Kenneth T, PA-C  HYDROcodone-acetaminophen (NORCO/VICODIN) 5-325 MG tablet Take 2 tablets by mouth every 4 (four) hours as needed. 12/08/16   Rise MuLeaphart, Kenneth T, PA-C  ibuprofen (ADVIL,MOTRIN) 200 MG tablet Take 200 mg by mouth every 8 (eight) hours as needed for mild pain.     [provider]  lisdexamfetamine (VYVANSE) 40 MG capsule Take 40 mg by mouth every morning.  [provider]  omeprazole (PRILOSEC OTC) 20 MG tablet Take 20 mg by mouth daily.    [provider]  venlafaxine XR (EFFEXOR-XR) 150 MG 24 hr capsule Take 150 mg by mouth daily.    [provider]    Family History Family History  Problem Relation Age of Onset  . Diabetes Father   . Hypertension Father   . Osteoporosis Mother   . Hypertension Mother     Social History Social History   Tobacco Use  . Smoking status: Former Smoker    Packs/day: 1.00    Years: 22.00    Pack years: 22.00    Types: Cigarettes    Start date: 01/25/2012    Last attempt to quit: 07/27/2012    Years since quitting: 5.6  . Smokeless tobacco: Never Used  . Tobacco comment: vapes with nicotene daily  Substance Use Topics  . Alcohol use: Yes    Comment: rarely  . Drug use: No     Allergies   Omnicef [cefdinir] and Penicillins   Review of Systems Review of Systems  Constitutional:  Negative for chills, fatigue and fever.  HENT: Negative for ear pain and sore throat.   Eyes: Negative for pain and visual disturbance.  Respiratory: Negative for cough and shortness of breath.   Cardiovascular: Negative for chest pain and palpitations.  Gastrointestinal: Negative for abdominal pain and vomiting.  Genitourinary: Negative for dysuria and hematuria.  Musculoskeletal: Negative for arthralgias, back pain, neck pain and stiffness.  Skin: Negative for color change, itching and rash.  Neurological: Negative for seizures and syncope.  All other systems reviewed and are negative.    Physical Exam Updated Vital Signs  ED Triage Vitals  Enc Vitals Group     BP 03/08/18 1606 119/82     Pulse Rate 03/08/18 1606 94     Resp 03/08/18 1606 14     Temp 03/08/18 1606 97.8 F (36.6 C)     Temp Source 03/08/18 1606 Oral     SpO2 03/08/18 1606 100 %     Weight 03/08/18 1606 221 lb (100.2 kg)     Height 03/08/18 1606 5\' 2"  (1.575 m)     Head Circumference --      Peak Flow --      Pain Score 03/08/18 1609 4     Pain Loc --      Pain Edu? --      Excl. in GC? --     Physical Exam Vitals signs and nursing note reviewed.  Constitutional:      General: She is not in acute distress.    Appearance: She is well-developed.  Eyes:     Conjunctiva/sclera: Conjunctivae normal.     Pupils: Pupils are equal, round, and reactive to light.  Cardiovascular:     Rate and Rhythm: Normal rate and regular rhythm.     Pulses: Normal pulses.     Heart sounds: Normal heart sounds. No murmur.  Pulmonary:     Effort: Pulmonary effort is normal. No respiratory distress.     Breath sounds: Normal breath sounds.  Abdominal:     General: There is no distension.     Palpations: Abdomen is soft.     Tenderness: There is no abdominal tenderness.  Musculoskeletal: Normal range of motion.        General: Tenderness (TTP to left calf area with increased tone) present. No swelling.     Right lower  leg: No edema.  Left lower leg: No edema.  Skin:    General: Skin is warm and dry.     Capillary Refill: Capillary refill takes less than 2 seconds.  Neurological:     Mental Status: She is alert.      ED Treatments / Results  Labs (all labs ordered are listed, but only abnormal results are displayed) Labs Reviewed - No data to display  EKG None  Radiology US Venous Img Lower  Left (dvt Study)  Result Date: 03/08/2018 CLINICAL DATA:  43 y/o F; left lower extremity pain and swelling for 1 month. EXAM: LEFT LOWER EXTREMITY VENOUS DOPPLER ULTRASOUND TECHNIQUE: Gray-scale sonography with graded compression, as well as color Doppler and duplex ultrasound were performed to evaluate the lower extremity deep venous systems from the level of the common femoral vein and including the common femoral, femoral, profunda femoral, popliteal and calf veins including the posterior tibial, peroneal and gastrocnemius veins when visible. The superficial great saphenous vein was also interrogated. Spectral Doppler was utilized to evaluate flow at rest and with distal augmentation maneuvers in the common femoral, femoral and popliteal veins. COMPARISON:  None. FINDINGS: Contralateral Common Femoral Vein: Respiratory phasicity is normal and symmetric with the symptomatic side. No evidence of thrombus. Normal compressibility. Common Femoral Vein: No evidence of thrombus. Normal compressibility, respiratory phasicity and response to augmentation. Saphenofemoral Junction: No evidence of thrombus. Normal compressibility and flow on color Doppler imaging. Profunda Femoral Vein: No evidence of thrombus. Normal compressibility and flow on color Doppler imaging. Femoral Vein: No evidence of thrombus. Normal compressibility, respiratory phasicity and response to augmentation. Popliteal Vein: No evidence of thrombus. Normal compressibility, respiratory phasicity and response to augmentation. Calf Veins: No evidence of  thrombus in the visible calf veins. Peroneal vein not visualized. Normal compressibility and flow on color Doppler imaging. Superficial Great Saphenous Vein: No evidence of thrombus. Normal compressibility. Venous Reflux:  None. Other Findings:  None. IMPRESSION: No evidence of deep venous thrombosis. Electronically Signed   By: Mitzi Hansen M.D.   On: 03/08/2018 18:26    Procedures Procedures (including critical care time)  Medications Ordered in ED Medications - No data to display   Initial Impression / Assessment and Plan / ED Course  I have reviewed the triage vital signs and the nursing notes.  Pertinent labs & imaging results that were available during my care of the patient were reviewed by me and considered in my medical decision making (see chart for details).        Daisy Hoffman is a 43 year old female with history of reflux who presents to the ED with left calf pain.  Patient with normal vitals.  No fever.  Pain has been intermittent for the last several days.  Had a recent virus.  States that she is on her feet a lot.  Unaware of any trauma.  Is mostly tender in the calf region on exam.  There is no obvious swelling, no asymmetry of the lower extremities.  DVT ultrasound was performed that showed no acute findings.  Suspect likely muscle strain.  Recommend Tylenol, Motrin, ice, rest.  Recommend follow-up with primary care doctor.  No concern for heart failure or other process.  There is no edema of the leg.  No concern for fracture or malalignment given no trauma history.  Patient is able to ambulate without any issues.  Given return precautions and recommend follow-up with primary care doctor.  This chart was dictated using voice recognition software.  Despite best efforts to  proofread,  errors can occur which can change the documentation meaning.    Final Clinical Impressions(s) / ED Diagnoses   Final diagnoses:  Left leg pain    ED Discharge Orders    None        Virgina NorfolkCuratolo, Walden Statz, DO 03/08/18 1830

## 2018-03-08 NOTE — ED Notes (Signed)
ED Provider at bedside. 

## 2018-03-08 NOTE — Discharge Instructions (Signed)
Continue Tylenol Motrin for pain.  Follow-up with primary care doctor if symptoms persist.  You likely have a muscle strain.

## 2018-03-13 ENCOUNTER — Other Ambulatory Visit: Payer: Self-pay

## 2018-03-13 DIAGNOSIS — M79606 Pain in leg, unspecified: Secondary | ICD-10-CM

## 2018-04-09 ENCOUNTER — Telehealth: Payer: Self-pay | Admitting: Vascular Surgery

## 2018-04-17 ENCOUNTER — Encounter: Payer: Medicaid Other | Admitting: Vascular Surgery

## 2018-04-17 ENCOUNTER — Encounter (HOSPITAL_COMMUNITY): Payer: Medicaid Other

## 2018-05-07 ENCOUNTER — Other Ambulatory Visit: Payer: Self-pay

## 2018-05-07 DIAGNOSIS — M79606 Pain in leg, unspecified: Secondary | ICD-10-CM

## 2018-05-23 ENCOUNTER — Ambulatory Visit (INDEPENDENT_AMBULATORY_CARE_PROVIDER_SITE_OTHER): Payer: Medicaid Other | Admitting: Vascular Surgery

## 2018-05-23 ENCOUNTER — Encounter: Payer: Self-pay | Admitting: Vascular Surgery

## 2018-05-23 ENCOUNTER — Other Ambulatory Visit: Payer: Self-pay

## 2018-05-23 ENCOUNTER — Ambulatory Visit (HOSPITAL_COMMUNITY)
Admission: RE | Admit: 2018-05-23 | Discharge: 2018-05-23 | Disposition: A | Discharge status: Home / Self Care | Payer: Medicaid Other | Source: Ambulatory Visit | Attending: Vascular Surgery | Admitting: Vascular Surgery

## 2018-05-23 VITALS — BP 122/87 | HR 96 | Temp 97.3°F | Resp 20 | Ht 62.0 in | Wt 232.7 lb

## 2018-05-23 DIAGNOSIS — I872 Venous insufficiency (chronic) (peripheral): Secondary | ICD-10-CM

## 2018-05-23 DIAGNOSIS — M79606 Pain in leg, unspecified: Secondary | ICD-10-CM | POA: Insufficient documentation

## 2018-05-23 NOTE — Progress Notes (Signed)
REASON FOR CONSULT:    Leg pain.  The consult is requested by [facility].  ASSESSMENT & PLAN:   LEFT LEG PAIN: This patient presents for evaluation of left leg pain that was mostly centered around her knee.  Her pain has improved significantly over the last several weeks.  It sounds like this pain is musculoskeletal in origin.  She did have a venous duplex scan on 03/08/2018 soon after she was having this pain and this showed no evidence of DVT.  I reassured her that her noninvasive arterial study today showed no evidence of significant peripheral vascular disease.  I have encouraged her to stay as active as possible.  She does have a history of some venous insufficiency and for this reason I have discussed with her the importance of intermittent leg elevation the proper positioning for this.  I have written her a prescription for knee-high compression stockings with a gradient of 15 to 20 mmHg.  I encouraged her to avoid prolonged sitting and standing.  I discussed the importance of exercise specifically walking and water aerobics.  I will be happy to see her back at any time if any new vascular issues arise.  Waverly Ferrari, MD, FACS Beeper [phone] Office: [phone]   HPI:   Daisy Hoffman is a pleasant 43 y.o. female, who was referred for evaluation of left leg pain. I have reviewed the records from the referring office.  The patient was seen on 02/26/2018 for an acute care visit.  The patient was having left leg pain.  The pain was behind the knee.  She had pulled a muscle in that same area about 2 weeks ago.  However her symptoms are getting worse.  The patient did have a venous duplex scan on 03/08/2018 which I have reviewed.  This was a left lower extremity venous duplex scan which showed no evidence of deep venous thrombosis.  The patient's symptoms have improved over the last several weeks and currently she is not having significant symptoms.  She does have a history of  restless leg syndrome.  I do not get any clear-cut history of claudication or rest pain.  She has no history of nonhealing wounds.  Her only real risk factor for peripheral vascular disease is a remote history of tobacco use.  She quit 8 years ago.  She denies any history of diabetes, hypertension, hypercholesterolemia, or family history of premature cardiovascular disease.  Past Medical History:  Diagnosis Date  . Allergy   . Anemia   . Anxiety   . Depression   . GERD (gastroesophageal reflux disease)    occ, due to gastric lapband  . Headache   . LGSIL (low grade squamous intraepithelial dysplasia) 11/2005, 11/2006, 02/2007, 08/2008.,02/2008   C&B LGSIL 11/2005, C&B 05/2006 PAP LGSIL A FEW HIGHER,  . Restless leg syndrome   . Snores     Family History  Problem Relation Age of Onset  . Diabetes Father   . Hypertension Father   . Osteoporosis Mother   . Hypertension Mother     SOCIAL HISTORY: Social History   Socioeconomic History  . Marital status: Divorced    Spouse name: Not on file  . Number of children: Not on file  . Years of education: Not on file  . Highest education level: Not on file  Occupational History  . Not on file  Social Needs  . Financial resource strain: Not on file  . Food insecurity:    Worry: Not on file  Inability: Not on file  . Transportation needs:    Medical: Not on file    Non-medical: Not on file  Tobacco Use  . Smoking status: Former Smoker    Packs/day: 1.00    Years: 22.00    Pack years: 22.00    Types: Cigarettes    Start date: 01/25/2012    Last attempt to quit: 07/27/2012    Years since quitting: 5.8  . Smokeless tobacco: Never Used  . Tobacco comment: vapes with nicotene daily  Substance and Sexual Activity  . Alcohol use: Yes    Comment: rarely  . Drug use: No  . Sexual activity: Not on file    Comment: Mirena 10/07/2013  Lifestyle  . Physical activity:    Days per week: Not on file    Minutes per session: Not on file   . Stress: Not on file  Relationships  . Social connections:    Talks on phone: Not on file    Gets together: Not on file    Attends religious service: Not on file    Active member of club or organization: Not on file    Attends meetings of clubs or organizations: Not on file    Relationship status: Not on file  . Intimate partner violence:    Fear of current or ex partner: Not on file    Emotionally abused: Not on file    Physically abused: Not on file    Forced sexual activity: Not on file  Other Topics Concern  . Not on file  Social History Narrative  . Not on file    Allergies  Allergen Reactions  . Omnicef [Cefdinir] Swelling    rash  . Penicillins Rash    Current Outpatient Medications  Medication Sig Dispense Refill  . ALPRAZolam (XANAX) 0.25 MG tablet Take by mouth.    Marland Kitchen. buPROPion (WELLBUTRIN XL) 300 MG 24 hr tablet Take 300 mg by mouth daily.    Marland Kitchen. gabapentin (NEURONTIN) 300 MG capsule Take 300 mg by mouth at bedtime.    Marland Kitchen. ibuprofen (ADVIL,MOTRIN) 200 MG tablet Take 200 mg by mouth every 8 (eight) hours as needed for mild pain.     Marland Kitchen. lisdexamfetamine (VYVANSE) 40 MG capsule Take 40 mg by mouth every morning.    . Melatonin 5 MG CAPS Take 1 capsule by mouth at bedtime.    Marland Kitchen. Specialty Vitamins Products (MAGNESIUM, AMINO ACID CHELATE,) 133 MG tablet Take 1 tablet by mouth 2 (two) times daily.    Marland Kitchen. venlafaxine XR (EFFEXOR-XR) 150 MG 24 hr capsule Take 150 mg by mouth daily.     Current Facility-Administered Medications  Medication Dose Route Frequency Provider Last Rate Last Dose  . levonorgestrel (MIRENA) 20 MCG/24HR IUD   Intrauterine Once Fontaine, Nadyne Coombesimothy P, MD        REVIEW OF SYSTEMS:  [X]  denotes positive finding, [ ]  denotes negative finding Cardiac  Comments:  Chest pain or chest pressure:    Shortness of breath upon exertion: x   Short of breath when lying flat:    Irregular heart rhythm:        Vascular    Pain in calf, thigh, or hip brought on by  ambulation:    Pain in feet at night that wakes you up from your sleep:     Blood clot in your veins:    Leg swelling:  x       Pulmonary    Oxygen at home:    Productive  cough:     Wheezing:         Neurologic    Sudden weakness in arms or legs:     Sudden numbness in arms or legs:     Sudden onset of difficulty speaking or slurred speech:    Temporary loss of vision in one eye:     Problems with dizziness:         Gastrointestinal    Blood in stool:     Vomited blood:         Genitourinary    Burning when urinating:     Blood in urine:        Psychiatric    Major depression:  x       Hematologic    Bleeding problems:    Problems with blood clotting too easily:        Skin    Rashes or ulcers:        Constitutional    Fever or chills:     PHYSICAL EXAM:   Vitals:   05/23/18 1336  BP: 122/87  Pulse: 96  Resp: 20  Temp: (!) 97.3 F (36.3 C)  SpO2: 97%  Weight: 232 lb 11.2 oz (105.6 kg)  Height:  (1.575 m)    GENERAL: The patient is a well-nourished female, in no acute distress. The vital signs are documented above. CARDIAC: There is a regular rate and rhythm.  VASCULAR: I do not detect carotid bruits. She has palpable femoral, dorsalis pedis, posterior tibial pulses bilaterally. Currently she has no significant lower extremity swelling. PULMONARY: There is good air exchange bilaterally without wheezing or rales. ABDOMEN: Soft and non-tender with normal pitched bowel sounds.  MUSCULOSKELETAL: There are no major deformities or cyanosis. NEUROLOGIC: No focal weakness or paresthesias are detected. SKIN: There are no ulcers or rashes noted. PSYCHIATRIC: The patient has a normal affect.  DATA:    ARTERIAL DOPPLER STUDY: I have independently interpreted her arterial Doppler study today.  On the right side there is a triphasic dorsalis pedis and posterior tibial signal.  ABI is 100%.  Toe pressure is 128 mmHg.  On the left side there is a triphasic  dorsalis pedis and posterior tibial signal.  ABI is 100%.  Toe pressure is 143 mmHg.

## 2018-06-21 ENCOUNTER — Encounter: Payer: Self-pay | Admitting: Diagnostic Neuroimaging

## 2018-06-21 ENCOUNTER — Telehealth: Payer: Self-pay | Admitting: Diagnostic Neuroimaging

## 2018-06-21 NOTE — Telephone Encounter (Signed)
Called patient and LVM requesting call back to update EMR.  

## 2018-06-21 NOTE — Telephone Encounter (Signed)
Pt gave consent for video visit.  Pt understands that although there may be some limitations with this type of visit, we will take all precautions to reduce any security or privacy concerns.  Pt understands that this will be treated like an in office visit and we will file with pt's insurance, and there may be a patient responsible charge related to this service. °

## 2018-06-21 NOTE — Telephone Encounter (Signed)
Patient called back and EMR updated.

## 2018-06-25 ENCOUNTER — Other Ambulatory Visit: Payer: Self-pay

## 2018-06-25 ENCOUNTER — Ambulatory Visit (INDEPENDENT_AMBULATORY_CARE_PROVIDER_SITE_OTHER): Payer: Medicaid Other | Admitting: Diagnostic Neuroimaging

## 2018-06-25 ENCOUNTER — Encounter: Payer: Self-pay | Admitting: Diagnostic Neuroimaging

## 2018-06-25 DIAGNOSIS — G43009 Migraine without aura, not intractable, without status migrainosus: Secondary | ICD-10-CM | POA: Diagnosis not present

## 2018-06-25 MED ORDER — RIZATRIPTAN BENZOATE 10 MG PO TBDP: 10.0000 mg | ORAL_TABLET | ORAL | 11 refills | Status: DC | PRN

## 2018-06-25 MED ORDER — TOPIRAMATE 50 MG PO TABS: 50.0000 mg | ORAL_TABLET | Freq: Two times a day (BID) | ORAL | 12 refills | Status: DC

## 2018-06-25 NOTE — Progress Notes (Signed)
[facility]  PATIENT: Daisy Hoffman DOB: 05/25/1975  REFERRING CLINICIAN: Cline CrockMonica Kelly-Coleman, NP HISTORY FROM: patient  REASON FOR VISIT: new consult    HISTORICAL  CHIEF COMPLAINT:  Chief Complaint  Patient presents with  . Headache    HISTORY OF PRESENT ILLNESS:   43 year old female here for evaluation of headaches.  Patient has had headaches since age 43 years old consisting of right parietal throbbing pain, nausea, sensitivity to light and sound, lasting hours at a time.  Sometimes she would see spots and sparkles before onset of headache.  She was treated with ibuprofen throughout her life.  Patient would have about 4 of these headaches per year.  In last 6 to 8 weeks patient has had increasing frequency of headaches, now 5 to 7 days/week.  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  Patient continues to take ibuprofen with mild relief.  Headache quality is similar to the headache she has had throughout her life.    REVIEW OF SYSTEMS: Full 14 system review of systems performed and negative with exception of: As per HPI.  ALLERGIES: Allergies  Allergen Reactions  . Omnicef [Cefdinir] Swelling    rash  . Penicillins Rash    HOME MEDICATIONS: Outpatient Medications Prior to Visit  Medication Sig Dispense Refill  . ALPRAZolam (XANAX) 0.25 MG tablet Take by mouth.    Marland Kitchen. buPROPion (WELLBUTRIN XL) 300 MG 24 hr tablet Take 300 mg by mouth daily.    Marland Kitchen. gabapentin (NEURONTIN) 300 MG capsule Take 300 mg by mouth at bedtime.    Marland Kitchen. ibuprofen (ADVIL,MOTRIN) 200 MG tablet Take 200 mg by mouth every 8 (eight) hours as needed for mild pain.     Marland Kitchen. lisdexamfetamine (VYVANSE) 40 MG capsule Take 40 mg by mouth every morning.    . Melatonin 5 MG CAPS Take 1 capsule by mouth at bedtime.    Marland Kitchen. Specialty Vitamins Products (MAGNESIUM, AMINO ACID CHELATE,) 133  MG tablet Take 1 tablet by mouth 2 (two) times daily.    Marland Kitchen. venlafaxine XR (EFFEXOR-XR) 150 MG 24 hr capsule Take 150 mg by mouth daily.     Facility-Administered Medications Prior to Visit  Medication Dose Route Frequency Provider Last Rate Last Dose  . levonorgestrel (MIRENA) 20 MCG/24HR IUD   Intrauterine Once Fontaine, Nadyne Coombesimothy P, MD        PAST MEDICAL HISTORY: Past Medical History:  Diagnosis Date  . Allergy   . Anemia   . Anxiety   . Depression   . GERD (gastroesophageal reflux disease)    occ, due to gastric lapband  . Headache   . LGSIL (low grade squamous intraepithelial dysplasia) 11/2005, 11/2006, 02/2007, 08/2008.,02/2008   C&B LGSIL 11/2005, C&B 05/2006 PAP LGSIL A FEW HIGHER,  . Restless leg syndrome   . Snores     PAST SURGICAL HISTORY: Past Surgical History:  Procedure Laterality Date  . CARPAL TUNNEL RELEASE Right 04/28/2016   Procedure: RIGHT CARPAL TUNNEL RELEASE;  Surgeon: Betha LoaKevin Kuzma, MD;  Location: [facility];  Service: Orthopedics;  Laterality: Right;  . CARPAL TUNNEL RELEASE Left 06/16/2016   Procedure: LEFT CARPAL TUNNEL RELEASE;  Surgeon: Betha LoaKuzma, Kevin, MD;  Location: [facility];  Service: Orthopedics;  Laterality: Left;  . FOOT SURGERY  1997  . INTRAUTERINE DEVICE INSERTION  10/07/2013   Mirena  . LAPAROSCOPIC GASTRIC BANDING  04/2008    FAMILY HISTORY:  Family History  Problem Relation Age of Onset  . Diabetes Father   . Hypertension Father   . Osteoporosis Mother   . Hypertension Mother     SOCIAL HISTORY: Social History   Socioeconomic History  . Marital status: Divorced    Spouse name: Not on file  . Number of children: 1  . Years of education: Not on file  . Highest education level: Associate degree: academic program  Occupational History  . Not on file  Social Needs  . Financial resource strain: Not on file  . Food insecurity:    Worry: Not on file    Inability: Not on file  . Transportation needs:     Medical: Not on file    Non-medical: Not on file  Tobacco Use  . Smoking status: Former Smoker    Packs/day: 1.00    Years: 22.00    Pack years: 22.00    Types: Cigarettes    Start date: 01/25/2012    Last attempt to quit: 07/27/2012    Years since quitting: 5.9  . Smokeless tobacco: Never Used  . Tobacco comment: vapes with nicotene daily  Substance and Sexual Activity  . Alcohol use: Yes    Comment: rarely  . Drug use: No  . Sexual activity: Not on file    Comment: Mirena 10/07/2013  Lifestyle  . Physical activity:    Days per week: Not on file    Minutes per session: Not on file  . Stress: Not on file  Relationships  . Social connections:    Talks on phone: Not on file    Gets together: Not on file    Attends religious service: Not on file    Active member of club or organization: Not on file    Attends meetings of clubs or organizations: Not on file    Relationship status: Not on file  . Intimate partner violence:    Fear of current or ex partner: Not on file    Emotionally abused: Not on file    Physically abused: Not on file    Forced sexual activity: Not on file  Other Topics Concern  . Not on file  Social History Narrative   Lives with daughter   Caffeine- coffee 5 cups daily     PHYSICAL EXAM   VIDEO EXAM  GENERAL EXAM/CONSTITUTIONAL:  Vitals: There were no vitals filed for this visit.  There is no height or weight on file to calculate BMI. Wt Readings from Last 3 Encounters:  05/23/18 232 lb 11.2 oz (105.6 kg)  03/08/18 221 lb (100.2 kg)  12/08/16 244 lb (110.7 kg)     Patient is in no distress; well developed, nourished and groomed; neck is supple   NEUROLOGIC: MENTAL STATUS:  No flowsheet data found.  awake, alert, oriented to person, place and time  recent and remote memory intact  normal attention and concentration  language fluent, comprehension intact, naming intact  fund of knowledge appropriate  CRANIAL NERVE:   2nd,  3rd, 4th, 6th - visual fields full to confrontation, extraocular muscles intact, no nystagmus  5th - facial sensation symmetric  7th - facial strength symmetric  8th - hearing intact  11th - shoulder shrug symmetric  12th - tongue protrusion midline  MOTOR:   NO TREMOR; NO DRIFT IN BUE  SENSORY:   normal and symmetric to light touch  COORDINATION:   fine finger movements normal     DIAGNOSTIC DATA (LABS, IMAGING, TESTING) - I reviewed  patient records, labs, notes, testing and imaging myself where available.  Lab Results  Component Value Date   WBC 7.5 12/08/2016   HGB 12.7 12/08/2016   HCT 38.8 12/08/2016   MCV 84.0 12/08/2016   PLT 246 12/08/2016      Component Value Date/Time   NA 135 12/08/2016 1910   K 3.6 12/08/2016 1910   CL 102 12/08/2016 1910   CO2 26 12/08/2016 1910   GLUCOSE 96 12/08/2016 1910   BUN 11 12/08/2016 1910   CREATININE 0.74 12/08/2016 1910   CALCIUM 8.9 12/08/2016 1910   PROT 6.8 10/10/2012 1530   ALBUMIN 3.8 10/10/2012 1530   AST 18 10/10/2012 1530   ALT 15 10/10/2012 1530   ALKPHOS 118 (H) 10/10/2012 1530   BILITOT 0.2 (L) 10/10/2012 1530   GFRNONAA >60 12/08/2016 1910   GFRAA >60 12/08/2016 1910   Lab Results  Component Value Date   CHOL 139 01/24/2011   HDL 36 (L) 01/24/2011   LDLCALC 75 01/24/2011   TRIG 141 01/24/2011   CHOLHDL 3.9 01/24/2011   No results found for: HGBA1C No results found for: VITAMINB12 No results found for: TSH     ASSESSMENT AND PLAN  43 y.o. year old female here with history of migraine with aura since age 43 years old, with increasing frequency of headaches since March 2020, in the setting of increased stress.  Most likely represents migraine with aura.   Dx:  1. Migraine without aura and without status migrainosus, not intractable     Virtual Visit via Video Note  I connected with Daisy MentionAmanda Siglin on 06/25/18 at  8:30 AM EDT by a video enabled telemedicine application and verified  that I am speaking with the correct person using two identifiers.  Location: Patient: home Provider: office   I discussed the limitations of evaluation and management by telemedicine and the availability of in person appointments. The patient expressed understanding and agreed to proceed.   I discussed the assessment and treatment plan with the patient. The patient was provided an opportunity to ask questions and all were answered. The patient agreed with the plan and demonstrated an understanding of the instructions.   The patient was advised to call back or seek an in-person evaluation if the symptoms worsen or if the condition fails to improve as anticipated.  I provided 30 minutes of non-face-to-face time during this encounter.    PLAN:  - MIGRAINE PREVENTION --> start topiramate 50mg  at bedtime; after 1 week increase to twice a day; drink plenty of water  - MIGRAINE RESCUE --> rizatriptan 10mg  as needed for breakthrough headache; may repeat x 1 after 2 hours; max 2 tabs per day or 8 per month  - if headaches worsen, fail to improve or change in quality, then may consider MRI brain  Meds ordered this encounter  Medications  . topiramate (TOPAMAX) 50 MG tablet    Sig: Take 1 tablet (50 mg total) by mouth 2 (two) times daily.    Dispense:  60 tablet    Refill:  12  . rizatriptan (MAXALT-MLT) 10 MG disintegrating tablet    Sig: Take 1 tablet (10 mg total) by mouth as needed for migraine. May repeat in 2 hours if needed    Dispense:  9 tablet    Refill:  11   Return in about 3 months (around 09/25/2018) for with NP (Amy Lomax).    Suanne MarkerVIKRAM R. PENUMALLI, MD 06/25/2018, 9:05 AM Certified in Neurology, Neurophysiology and Neuroimaging  [facility] [address] [place],  [address] [phone]

## 2018-07-19 ENCOUNTER — Telehealth: Payer: Self-pay | Admitting: *Deleted

## 2018-07-19 NOTE — Telephone Encounter (Signed)
LVM advising patient to call back and schedule 3 month FU with A Lomax, NP per Dr Gladstone Lighter note.

## 2018-08-02 ENCOUNTER — Other Ambulatory Visit: Payer: Self-pay | Admitting: Orthopedic Surgery

## 2018-08-02 DIAGNOSIS — M545 Low back pain, unspecified: Secondary | ICD-10-CM

## 2018-08-02 DIAGNOSIS — M5126 Other intervertebral disc displacement, lumbar region: Secondary | ICD-10-CM

## 2018-08-28 ENCOUNTER — Ambulatory Visit
Admission: RE | Admit: 2018-08-28 | Discharge: 2018-08-28 | Disposition: A | Discharge status: Home / Self Care | Payer: Medicaid Other | Source: Ambulatory Visit | Attending: Orthopedic Surgery | Admitting: Orthopedic Surgery

## 2018-08-28 ENCOUNTER — Other Ambulatory Visit: Payer: Self-pay

## 2018-08-28 DIAGNOSIS — M545 Low back pain, unspecified: Secondary | ICD-10-CM

## 2018-08-28 DIAGNOSIS — M5126 Other intervertebral disc displacement, lumbar region: Secondary | ICD-10-CM

## 2019-03-27 ENCOUNTER — Encounter (HOSPITAL_COMMUNITY): Payer: Self-pay | Admitting: Emergency Medicine

## 2019-03-27 ENCOUNTER — Emergency Department (HOSPITAL_COMMUNITY): Payer: Medicaid Other

## 2019-03-27 ENCOUNTER — Other Ambulatory Visit: Payer: Self-pay

## 2019-03-27 ENCOUNTER — Emergency Department (HOSPITAL_COMMUNITY)
Admission: EM | Admit: 2019-03-27 | Discharge: 2019-03-27 | Disposition: A | Discharge status: Home / Self Care | Payer: Medicaid Other | Attending: Emergency Medicine | Admitting: Emergency Medicine

## 2019-03-27 DIAGNOSIS — Z79899 Other long term (current) drug therapy: Secondary | ICD-10-CM | POA: Diagnosis not present

## 2019-03-27 DIAGNOSIS — F17200 Nicotine dependence, unspecified, uncomplicated: Secondary | ICD-10-CM | POA: Diagnosis not present

## 2019-03-27 DIAGNOSIS — R0602 Shortness of breath: Secondary | ICD-10-CM | POA: Insufficient documentation

## 2019-03-27 DIAGNOSIS — R0789 Other chest pain: Secondary | ICD-10-CM | POA: Diagnosis present

## 2019-03-27 LAB — COMPREHENSIVE METABOLIC PANEL
ALT: 12 U/L (ref 0–44)
AST: 16 U/L (ref 15–41)
Albumin: 3.7 g/dL (ref 3.5–5.0)
Alkaline Phosphatase: 78 U/L (ref 38–126)
Anion gap: 9 (ref 5–15)
BUN: 8 mg/dL (ref 6–20)
CO2: 22 mmol/L (ref 22–32)
Calcium: 8.4 mg/dL — ABNORMAL LOW (ref 8.9–10.3)
Chloride: 107 mmol/L (ref 98–111)
Creatinine, Ser: 0.95 mg/dL (ref 0.44–1.00)
GFR calc Af Amer: >60 mL/min (ref >60)
GFR calc non Af Amer: >60 mL/min (ref >60)
Glucose, Bld: 90 mg/dL (ref 70–99)
Potassium: 4 mmol/L (ref 3.5–5.1)
Sodium: 138 mmol/L (ref 135–145)
Total Bilirubin: 0.6 mg/dL (ref 0.3–1.2)
Total Protein: 5.6 g/dL — ABNORMAL LOW (ref 6.5–8.1)

## 2019-03-27 LAB — CBC WITH DIFFERENTIAL/PLATELET
Abs Immature Granulocytes: 0.01 10*3/uL (ref 0.00–0.07)
Basophils Absolute: 0 10*3/uL (ref 0.0–0.1)
Basophils Relative: 1 %
Eosinophils Absolute: 0.1 10*3/uL (ref 0.0–0.5)
Eosinophils Relative: 2 %
HCT: 43.2 % (ref 36.0–46.0)
Hemoglobin: 13.7 g/dL (ref 12.0–15.0)
Immature Granulocytes: 0 %
Lymphocytes Relative: 33 %
Lymphs Abs: 1.9 10*3/uL (ref 0.7–4.0)
MCH: 27.4 pg (ref 26.0–34.0)
MCHC: 31.7 g/dL (ref 30.0–36.0)
MCV: 86.4 fL (ref 80.0–100.0)
Monocytes Absolute: 0.4 10*3/uL (ref 0.1–1.0)
Monocytes Relative: 6 %
Neutro Abs: 3.3 10*3/uL (ref 1.7–7.7)
Neutrophils Relative %: 58 %
Platelets: 246 10*3/uL (ref 150–400)
RBC: 5 MIL/uL (ref 3.87–5.11)
RDW: 12.9 % (ref 11.5–15.5)
WBC: 5.7 10*3/uL (ref 4.0–10.5)
nRBC: 0 % (ref 0.0–0.2)

## 2019-03-27 LAB — TROPONIN I (HIGH SENSITIVITY)
Troponin I (High Sensitivity): 3 ng/L (ref <18)
Troponin I (High Sensitivity): 2 ng/L (ref <18)

## 2019-03-27 LAB — D-DIMER, QUANTITATIVE: D-Dimer, Quant: 0.32 ug/mL-FEU (ref 0.00–0.50)

## 2019-03-27 NOTE — ED Triage Notes (Signed)
Pt arrives to ED from [facility] with complaints of substernal chest pain and shortness of breath on exertion while at work today. Patient states her legs started to swell last week as well. Patient received 324 ASA and x1 nitro with EMS.

## 2019-03-27 NOTE — ED Provider Notes (Signed)
[place] EMERGENCY DEPARTMENT Provider Note   CSN: [id number] Arrival date & time: 03/27/19  1005     History Chief Complaint  Patient presents with  . Chest Pain  . Shortness of Breath    Daisy Hoffman is a 44 y.o. female.  HPI   This patient is a 44 year old female, she has a history of acid reflux, restless leg syndrome and some anxiety and depression.  She currently takes Wellbutrin and Effexor.  She does vape but has not smoked in over 10 years, she does not use any drugs or drink alcohol.  She presents to the hospital today from the urgent care where she was transferred after developing chest pain this morning.  She reports she had a normal morning, woke up and as she was at work walking between classroom she developed a heaviness on the left side of her chest with some associated palpitations and shortness of breath.  When she went to the urgent care she was given 4 baby aspirin and the ambulance was called for transport.  There was no report of a prehospital abnormal EKG.  The patient reports that her symptoms have significantly improved and though she still has a slight pressure she has no other symptoms at this time.  She does report some mild swelling of her legs, there is a family member that has had a blood clot in the past and a grandmother on her mother side who had a heart attack in her 53s.  This patient has never been treated for diabetes or hypertension and has never had any heart disease that she knows of.  She has not had any arrhythmias.  She has not been formally evaluated by cardiology and she does not exercise regularly.  She has never had symptoms like this in the past.  Past Medical History:  Diagnosis Date  . Allergy   . Anemia   . Anxiety   . Depression   . GERD (gastroesophageal reflux disease)    occ, due to gastric lapband  . Headache   . LGSIL (low grade squamous intraepithelial dysplasia) 11/2005, 11/2006, 02/2007,  08/2008.,02/2008   C&B LGSIL 11/2005, C&B 05/2006 PAP LGSIL A FEW HIGHER,  . Restless leg syndrome   . Snores     Patient Active Problem List   Diagnosis Date Noted  . Chest wall pain 03/27/2019  . Lapband AP in [facility] April 2010 10/11/2012  . Depression   . Anxiety   . IUD   . OBESITY 04/20/2006  . TOBACCO ABUSE 04/20/2006    Past Surgical History:  Procedure Laterality Date  . CARPAL TUNNEL RELEASE Right 04/28/2016   Procedure: RIGHT CARPAL TUNNEL RELEASE;  Surgeon: Leanora Cover, MD;  Location: [place];  Service: Orthopedics;  Laterality: Right;  . CARPAL TUNNEL RELEASE Left 06/16/2016   Procedure: LEFT CARPAL TUNNEL RELEASE;  Surgeon: Leanora Cover, MD;  Location: [place];  Service: Orthopedics;  Laterality: Left;  . FOOT SURGERY  1997  . INTRAUTERINE DEVICE INSERTION  10/07/2013   Mirena  . LAPAROSCOPIC GASTRIC BANDING  04/2008     OB History    Gravida  2   Para  1   Term  1   Preterm      AB  1   Living  1     SAB      TAB      Ectopic      Multiple      Live Births  Family History  Problem Relation Age of Onset  . Diabetes Father   . Hypertension Father   . Osteoporosis Mother   . Hypertension Mother     Social History   Tobacco Use  . Smoking status: Former Smoker    Packs/day: 1.00    Years: 22.00    Pack years: 22.00    Types: Cigarettes    Start date: 01/25/2012    Quit date: 07/27/2012    Years since quitting: 6.6  . Smokeless tobacco: Never Used  . Tobacco comment: vapes with nicotene daily  Substance Use Topics  . Alcohol use: Yes    Comment: rarely  . Drug use: No    Home Medications Prior to Admission medications   Medication Sig Start Date End Date Taking? Authorizing Provider  ALPRAZolam Prudy Feeler) 0.25 MG tablet Take 0.25 mg by mouth as needed for anxiety.  04/01/14  Yes [provider]  buPROPion (WELLBUTRIN XL) 300 MG 24 hr tablet Take 300 mg by mouth daily.    Yes [provider]  gabapentin (NEURONTIN) 300 MG capsule Take 300 mg by mouth at bedtime.   Yes [provider]  ibuprofen (ADVIL,MOTRIN) 200 MG tablet Take 200 mg by mouth every 8 (eight) hours as needed for mild pain.    Yes [provider]  lisdexamfetamine (VYVANSE) 40 MG capsule Take 40 mg by mouth every morning.   Yes [provider]  Melatonin 5 MG CAPS Take 5 mg by mouth at bedtime.    Yes [provider]  rizatriptan (MAXALT-MLT) 10 MG disintegrating tablet Take 1 tablet (10 mg total) by mouth as needed for migraine. May repeat in 2 hours if needed 06/25/18  Yes Penumalli, Glenford Bayley, MD  Specialty Vitamins Products (MAGNESIUM, AMINO ACID CHELATE,) 133 MG tablet Take 1 tablet by mouth once a week.    Yes [provider]  topiramate (TOPAMAX) 50 MG tablet Take 1 tablet (50 mg total) by mouth 2 (two) times daily. Patient taking differently: Take 50 mg by mouth daily.  06/25/18  Yes Penumalli, Glenford Bayley, MD  venlafaxine XR (EFFEXOR-XR) 150 MG 24 hr capsule Take 150 mg by mouth daily.   Yes [provider]    Allergies    Omnicef [cefdinir] and Penicillins  Review of Systems   Review of Systems  All other systems reviewed and are negative.   Physical Exam Updated Vital Signs BP (!) 121/96   Pulse 80   Temp 98.1 F (36.7 C) (Oral)   Resp (!) 23   SpO2 100%   Physical Exam Vitals and nursing note reviewed.  Constitutional:      General: She is not in acute distress.    Appearance: She is well-developed.  HENT:     Head: Normocephalic and atraumatic.     Mouth/Throat:     Pharynx: No oropharyngeal exudate.  Eyes:     General: No scleral icterus.       Right eye: No discharge.        Left eye: No discharge.     Conjunctiva/sclera: Conjunctivae normal.     Pupils: Pupils are equal, round, and reactive to light.  Neck:     Thyroid: No thyromegaly.     Vascular: No JVD.  Cardiovascular:     Rate and Rhythm: Normal  rate and regular rhythm.     Heart sounds: Normal heart sounds. No murmur. No friction rub. No gallop.   Pulmonary:     Effort: Pulmonary effort is  normal. No respiratory distress.     Breath sounds: Normal breath sounds. No wheezing or rales.  Abdominal:     General: Bowel sounds are normal. There is no distension.     Palpations: Abdomen is soft. There is no mass.     Tenderness: There is no abdominal tenderness.  Musculoskeletal:        General: No tenderness. Normal range of motion.     Cervical back: Normal range of motion and neck supple.  Lymphadenopathy:     Cervical: No cervical adenopathy.  Skin:    General: Skin is warm and dry.     Findings: No erythema or rash.  Neurological:     Mental Status: She is alert.     Coordination: Coordination normal.  Psychiatric:        Behavior: Behavior normal.     ED Results / Procedures / Treatments   Labs (all labs ordered are listed, but only abnormal results are displayed) Labs Reviewed  COMPREHENSIVE METABOLIC PANEL - Abnormal; Notable for the following components:      Result Value   Calcium 8.4 (*)    Total Protein 5.6 (*)    All other components within normal limits  D-DIMER, QUANTITATIVE (NOT AT [facility])  CBC WITH DIFFERENTIAL/PLATELET  TROPONIN I (HIGH SENSITIVITY)  TROPONIN I (HIGH SENSITIVITY)    EKG EKG Interpretation  Date/Time:  Wednesday March 27 2019 10:38:17 EST Ventricular Rate:  71 PR Interval:  152 QRS Duration: 66 QT Interval:  374 QTC Calculation: 406 R Axis:   3 Text Interpretation: Normal sinus rhythm Inferior infarct , age undetermined Anterior infarct , age undetermined Abnormal ECG since last tracing no significant change Confirmed by Eber Hong ([id number]) on 03/27/2019 10:47:31 AM   Radiology DG Chest Port 1 View  Result Date: 03/27/2019 CLINICAL DATA:  Chest pain EXAM: PORTABLE CHEST 1 VIEW COMPARISON:  03/10/2014 FINDINGS: Low volume chest. There is no edema, consolidation, effusion, or  pneumothorax. Normal heart size for technique. Lap band in good position where covered. IMPRESSION: No evidence of active disease. Electronically Signed   By: Marnee Spring M.D.   On: 03/27/2019 10:45    Procedures Procedures (including critical care time)  Medications Ordered in ED Medications - No data to display  ED Course  I have reviewed the triage vital signs and the nursing notes.  Pertinent labs & imaging results that were available during my care of the patient were reviewed by me and considered in my medical decision making (see chart for details).  Clinical Course as of Mar 27 2252  Wed Mar 27, 2019  1357 The patient has a second troponin which is essentially unremarkable at 7, her heart score is very low   [BM]    Clinical Course User Index [BM] Eber Hong, MD   MDM Rules/Calculators/A&P                      This patient has a rather unremarkable exam thankfully.  I have reviewed her prehospital EKG and cardiac monitoring strips and there is no signs of arrhythmia or ischemia.  The patient will get a D-dimer as well as a troponin, she may qualify for a second troponin since her heart score is low.  She is otherwise well-appearing, cardiac monitoring started, chest x-ray ordered.  Paramedics do report that she had slight hypotension with nitroglycerin prehospital  Second troponin negative, patient has minimal symptoms at discharge, has ruled out for acute coronary syndrome and can  follow-up with cardiology, patient agreeable  Final Clinical Impression(s) / ED Diagnoses Final diagnoses:  Chest heaviness    Rx / DC Orders ED Discharge Orders    None       Eber Hong, MD 03/28/19 2254

## 2019-03-27 NOTE — Discharge Instructions (Signed)
You will need to follow-up very closely with a cardiac doctor, the cardiologist phone number as above.  Please call for the next available appointment, you should be seen within 3 or 4 days.  Seek medical exam for any severe or worsening symptoms and please take 1 baby aspirin a day until you follow-up with the heart doctor.  Your testing was reassuring

## 2020-01-14 ENCOUNTER — Other Ambulatory Visit (HOSPITAL_BASED_OUTPATIENT_CLINIC_OR_DEPARTMENT_OTHER): Payer: Self-pay

## 2020-01-14 DIAGNOSIS — R0683 Snoring: Secondary | ICD-10-CM

## 2020-01-14 DIAGNOSIS — R0681 Apnea, not elsewhere classified: Secondary | ICD-10-CM

## 2020-01-14 DIAGNOSIS — R5383 Other fatigue: Secondary | ICD-10-CM

## 2020-02-13 ENCOUNTER — Other Ambulatory Visit: Payer: Self-pay

## 2020-02-13 ENCOUNTER — Ambulatory Visit (HOSPITAL_BASED_OUTPATIENT_CLINIC_OR_DEPARTMENT_OTHER): Payer: Medicaid Other | Attending: Internal Medicine | Admitting: Internal Medicine

## 2020-02-13 DIAGNOSIS — G4761 Periodic limb movement disorder: Secondary | ICD-10-CM | POA: Diagnosis not present

## 2020-02-13 DIAGNOSIS — R0681 Apnea, not elsewhere classified: Secondary | ICD-10-CM

## 2020-02-13 DIAGNOSIS — R0683 Snoring: Secondary | ICD-10-CM | POA: Diagnosis present

## 2020-02-13 DIAGNOSIS — Z79899 Other long term (current) drug therapy: Secondary | ICD-10-CM | POA: Diagnosis not present

## 2020-02-13 DIAGNOSIS — R5383 Other fatigue: Secondary | ICD-10-CM

## 2020-02-14 ENCOUNTER — Other Ambulatory Visit (HOSPITAL_BASED_OUTPATIENT_CLINIC_OR_DEPARTMENT_OTHER): Payer: Self-pay

## 2020-02-14 DIAGNOSIS — R5383 Other fatigue: Secondary | ICD-10-CM

## 2020-02-14 DIAGNOSIS — R0681 Apnea, not elsewhere classified: Secondary | ICD-10-CM

## 2020-02-14 DIAGNOSIS — R0683 Snoring: Secondary | ICD-10-CM

## 2020-02-16 DIAGNOSIS — R0683 Snoring: Secondary | ICD-10-CM | POA: Diagnosis not present

## 2020-02-16 DIAGNOSIS — R0681 Apnea, not elsewhere classified: Secondary | ICD-10-CM | POA: Diagnosis not present

## 2020-02-16 DIAGNOSIS — R5383 Other fatigue: Secondary | ICD-10-CM | POA: Diagnosis not present

## 2020-02-16 NOTE — Procedures (Signed)
   Patient Name: Daisy Hoffman, Daisy Hoffman Date: 02/13/2020 Gender: Female D.O.B: 02/09/1975 Age (years): 45 Referring Provider: Domenick Gong PA-C Height (inches): 62 Interpreting Physician: Jetty Duhamel MD, ABSM Weight (lbs): 235 RPSGT: Armen Pickup BMI: 43 MRN: [id number] Neck Size: 17.00  CLINICAL INFORMATION Sleep Study Type: NPSG Indication for sleep study: Snoring Epworth Sleepiness Score: 14  SLEEP STUDY TECHNIQUE As per the AASM Manual for the Scoring of Sleep and Associated Events v2.3 (April 2016) with a hypopnea requiring 4% desaturations.  The channels recorded and monitored were frontal, central and occipital EEG, electrooculogram (EOG), submentalis EMG (chin), nasal and oral airflow, thoracic and abdominal wall motion, anterior tibialis EMG, snore microphone, electrocardiogram, and pulse oximetry.  MEDICATIONS Medications self-administered by patient taken the night of the study : NEURONTIN, Prudy Feeler  SLEEP ARCHITECTURE The study was initiated at 10:05:41 PM and ended at 4:45:10 AM.  Sleep onset time was 22.3 minutes and the sleep efficiency was 91.7%%. The total sleep time was 366.5 minutes.  Stage REM latency was 234.0 minutes.  The patient spent 6.1%% of the night in stage N1 sleep, 83.9%% in stage N2 sleep, 2.0%% in stage N3 and 7.9% in REM.  Alpha intrusion was absent.  Supine sleep was 56.89%.  RESPIRATORY PARAMETERS The overall apnea/hypopnea index (AHI) was 0.7 per hour. There were 1 total apneas, including 1 obstructive, 0 central and 0 mixed apneas. There were 3 hypopneas and 4 RERAs.  The AHI during Stage REM sleep was 0.0 per hour.  AHI while supine was 0.9 per hour.  The mean oxygen saturation was 94.0%. The minimum SpO2 during sleep was 88.0%.  loud snoring was noted during this study.  CARDIAC DATA The 2 lead EKG demonstrated sinus rhythm. The mean heart rate was 79.7 beats per minute. Other EKG findings include: None.  LEG MOVEMENT  DATA The total PLMS were 0 with a resulting PLMS index of 0.0. Associated arousal with leg movement index was 2.6 .  IMPRESSIONS - No significant obstructive sleep apnea occurred during this study (AHI = 0.7/h). - No significant central sleep apnea occurred during this study (CAI = 0.0/h). - The patient had minimal oxygen desaturation during the study (Min O2 = 88.0%), Mean O2 saturation 94%. - The patient snored with loud snoring volume. - No cardiac abnormalities were noted during this study. - Clinically significant periodic limb movements. Total Limb movements 510 (83.5/ hr). Limb movements with arousal or awakening 16 ( 2.6/ hr).  DIAGNOSIS - Primary snoring - Periodic Limb Movement  RECOMMENDATIONS - Manage for symptoms and snoring based on clinical judgment. - Assess for iron deficiency and consider treatment for limb movement sleep disorder. - Sleep hygiene should be reviewed to assess factors that may improve sleep quality. - Weight management and regular exercise should be initiated or continued if appropriate.  [Electronically signed] 02/16/2020 12:52 PM  Jetty Duhamel MD, ABSM Diplomate, American Board of Sleep Medicine   NPI: [id number]                          Jetty Duhamel Diplomate, American Board of Sleep Medicine  ELECTRONICALLY SIGNED ON:  02/16/2020, 12:46 PM [name] PH: [phone]   FX: [phone] ACCREDITED BY THE AMERICAN ACADEMY OF SLEEP MEDICINE

## 2020-03-03 ENCOUNTER — Other Ambulatory Visit: Payer: Self-pay | Admitting: Physician Assistant

## 2020-03-03 DIAGNOSIS — Z1231 Encounter for screening mammogram for malignant neoplasm of breast: Secondary | ICD-10-CM

## 2020-04-21 ENCOUNTER — Ambulatory Visit
Admission: RE | Admit: 2020-04-21 | Discharge: 2020-04-21 | Disposition: A | Discharge status: Home / Self Care | Payer: Medicaid Other | Source: Ambulatory Visit | Attending: Physician Assistant | Admitting: Physician Assistant

## 2020-04-21 ENCOUNTER — Other Ambulatory Visit: Payer: Self-pay

## 2020-04-21 DIAGNOSIS — Z1231 Encounter for screening mammogram for malignant neoplasm of breast: Secondary | ICD-10-CM

## 2020-06-14 ENCOUNTER — Other Ambulatory Visit: Payer: Self-pay

## 2020-06-14 ENCOUNTER — Ambulatory Visit
Admission: EM | Admit: 2020-06-14 | Discharge: 2020-06-14 | Disposition: A | Discharge status: Home / Self Care | Payer: Medicaid Other | Attending: Emergency Medicine | Admitting: Emergency Medicine

## 2020-06-14 DIAGNOSIS — J069 Acute upper respiratory infection, unspecified: Secondary | ICD-10-CM | POA: Diagnosis not present

## 2020-06-14 LAB — POCT RAPID STREP A (OFFICE): Rapid Strep A Screen: NEGATIVE

## 2020-06-14 MED ORDER — BENZONATATE 200 MG PO CAPS: 200.0000 mg | ORAL_CAPSULE | Freq: Three times a day (TID) | ORAL | 0 refills | Status: AC | PRN

## 2020-06-14 MED ORDER — IBUPROFEN 800 MG PO TABS: 800.0000 mg | ORAL_TABLET | Freq: Three times a day (TID) | ORAL | 0 refills | Status: DC

## 2020-06-14 MED ORDER — DOXYCYCLINE HYCLATE 100 MG PO CAPS: 100.0000 mg | ORAL_CAPSULE | Freq: Two times a day (BID) | ORAL | 0 refills | Status: AC

## 2020-06-14 NOTE — Discharge Instructions (Addendum)
Strep is negative, COVID test pending Ibuprofen and Tylenol for pain Continue daily Zyrtec, Mucinex Tessalon for cough Rest and fluids If not seeing any improvement over the next 3 to 4 days and COVID test negative May fill prescription for doxycycline.

## 2020-06-14 NOTE — ED Provider Notes (Signed)
[facility]    CSN: [id number] Arrival date & time: 06/14/20  1016      History   Chief Complaint Chief Complaint  Patient presents with  . Sore Throat    HPI Daisy Hoffman is a 45 y.o. female presenting today for evaluation of URI symptoms and sore throat.  Reports over the past 4 to 5 days has had congestion, sore throat, hoarseness, mild cough.  Reports worsening sore throat over the past 24 hours.  Cough worse at nighttime.  Denies fevers.  HPI  Past Medical History:  Diagnosis Date  . Allergy   . Anemia   . Anxiety   . Depression   . GERD (gastroesophageal reflux disease)    occ, due to gastric lapband  . Headache   . LGSIL (low grade squamous intraepithelial dysplasia) 11/2005, 11/2006, 02/2007, 08/2008.,02/2008   C&B LGSIL 11/2005, C&B 05/2006 PAP LGSIL A FEW HIGHER,  . Restless leg syndrome   . Snores     Patient Active Problem List   Diagnosis Date Noted  . Chest wall pain 03/27/2019  . Lapband AP in [facility] April 2010 10/11/2012  . Depression   . Anxiety   . IUD   . OBESITY 04/20/2006  . TOBACCO ABUSE 04/20/2006    Past Surgical History:  Procedure Laterality Date  . CARPAL TUNNEL RELEASE Right 04/28/2016   Procedure: RIGHT CARPAL TUNNEL RELEASE;  Surgeon: Betha Loa, MD;  Location: [facility];  Service: Orthopedics;  Laterality: Right;  . CARPAL TUNNEL RELEASE Left 06/16/2016   Procedure: LEFT CARPAL TUNNEL RELEASE;  Surgeon: Betha Loa, MD;  Location: [facility];  Service: Orthopedics;  Laterality: Left;  . FOOT SURGERY  1997  . INTRAUTERINE DEVICE INSERTION  10/07/2013   Mirena  . LAPAROSCOPIC GASTRIC BANDING  04/2008    OB History    Gravida  2   Para  1   Term  1   Preterm      AB  1   Living  1     SAB      IAB      Ectopic      Multiple      Live Births               Home Medications    Prior to Admission medications   Medication Sig Start Date End Date Taking?  Authorizing Provider  benzonatate (TESSALON) 200 MG capsule Take 1 capsule (200 mg total) by mouth 3 (three) times daily as needed for up to 7 days for cough. 06/14/20 06/21/20 Yes Cyenna Rebello C, PA-C  doxycycline (VIBRAMYCIN) 100 MG capsule Take 1 capsule (100 mg total) by mouth 2 (two) times daily for 7 days. 06/17/20 06/24/20 Yes Robb Sibal C, PA-C  ibuprofen (ADVIL) 800 MG tablet Take 1 tablet (800 mg total) by mouth 3 (three) times daily. 06/14/20  Yes Tola Meas C, PA-C  ALPRAZolam (XANAX) 0.25 MG tablet Take 0.25 mg by mouth as needed for anxiety.  04/01/14   [provider]  buPROPion (WELLBUTRIN XL) 300 MG 24 hr tablet Take 300 mg by mouth daily.    [provider]  gabapentin (NEURONTIN) 300 MG capsule Take 300 mg by mouth at bedtime.    [provider]  lisdexamfetamine (VYVANSE) 40 MG capsule Take 40 mg by mouth every morning.    [provider]  Melatonin 5 MG CAPS Take 5 mg by mouth at bedtime.     [provider]  rizatriptan (MAXALT-MLT) 10 MG disintegrating tablet Take 1 tablet (10 mg total) by mouth as needed for migraine. May repeat in 2 hours if needed 06/25/18   Penumalli, Glenford Bayley, MD  Specialty Vitamins Products (MAGNESIUM, AMINO ACID CHELATE,) 133 MG tablet Take 1 tablet by mouth once a week.     [provider]  topiramate (TOPAMAX) 50 MG tablet Take 1 tablet (50 mg total) by mouth 2 (two) times daily. Patient taking differently: Take 50 mg by mouth daily.  06/25/18   Penumalli, Glenford Bayley, MD  venlafaxine XR (EFFEXOR-XR) 150 MG 24 hr capsule Take 150 mg by mouth daily.    [provider]    Family History Family History  Problem Relation Age of Onset  . Diabetes Father   . Hypertension Father   . Osteoporosis Mother   . Hypertension Mother     Social History Social History   Tobacco Use  . Smoking status: Former Smoker    Packs/day: 1.00    Years: 22.00    Pack years: 22.00    Types: Cigarettes     Start date: 01/25/2012    Quit date: 07/27/2012    Years since quitting: 7.8  . Smokeless tobacco: Never Used  . Tobacco comment: vapes with nicotene daily  Vaping Use  . Vaping Use: Every day  . Substances: Nicotine, CBD, Flavoring  Substance Use Topics  . Alcohol use: Yes    Comment: rarely  . Drug use: No     Allergies   Omnicef [cefdinir] and Penicillins   Review of Systems Review of Systems  Constitutional: Negative for activity change, appetite change, chills, fatigue and fever.  HENT: Positive for congestion, rhinorrhea, sinus pressure, sore throat and voice change. Negative for ear pain and trouble swallowing.   Eyes: Negative for discharge and redness.  Respiratory: Positive for cough. Negative for chest tightness and shortness of breath.   Cardiovascular: Negative for chest pain.  Gastrointestinal: Positive for nausea. Negative for abdominal pain, diarrhea and vomiting.  Musculoskeletal: Negative for myalgias.  Skin: Negative for rash.  Neurological: Negative for dizziness, light-headedness and headaches.     Physical Exam Triage Vital Signs ED Triage Vitals  Enc Vitals Group     BP 06/14/20 1055 120/77     Pulse Rate 06/14/20 1055 87     Resp 06/14/20 1055 18     Temp 06/14/20 1055 98.5 F (36.9 C)     Temp Source 06/14/20 1055 Oral     SpO2 06/14/20 1055 97 %     Weight --      Height --      Head Circumference --      Peak Flow --      Pain Score 06/14/20 1058 7     Pain Loc --      Pain Edu? --      Excl. in GC? --    No data found.  Updated Vital Signs BP 120/77 (BP Location: Left Arm)   Pulse 87   Temp 98.5 F (36.9 C) (Oral)   Resp 18   SpO2 97%   Visual Acuity Right Eye Distance:   Left Eye Distance:   Bilateral Distance:    Right Eye Near:   Left Eye Near:    Bilateral Near:     Physical Exam Vitals and nursing note reviewed.  Constitutional:      Appearance: She is well-developed.     Comments: No acute distress  HENT:  Head: Normocephalic and atraumatic.     Ears:     Comments: Bilateral ears without tenderness to palpation of external auricle, tragus and mastoid, EAC's without erythema or swelling, TM's with good bony landmarks and cone of light. Non erythematous.     Nose: Nose normal.     Mouth/Throat:     Comments: Oral mucosa pink and moist, no tonsillar enlargement or exudate. Posterior pharynx patent and nonerythematous, no uvula deviation or swelling. Normal phonation. Eyes:     Conjunctiva/sclera: Conjunctivae normal.  Cardiovascular:     Rate and Rhythm: Normal rate and regular rhythm.  Pulmonary:     Effort: Pulmonary effort is normal. No respiratory distress.     Comments: Breathing comfortably at rest, CTABL, no wheezing, rales or other adventitious sounds auscultated Abdominal:     General: There is no distension.  Musculoskeletal:        General: Normal range of motion.     Cervical back: Neck supple.  Skin:    General: Skin is warm and dry.  Neurological:     Mental Status: She is alert and oriented to person, place, and time.      UC Treatments / Results  Labs (all labs ordered are listed, but only abnormal results are displayed) Labs Reviewed  CULTURE, GROUP A STREP ([facility])  NOVEL CORONAVIRUS, NAA  POCT RAPID STREP A (OFFICE)    EKG   Radiology No results found.  Procedures Procedures (including critical care time)  Medications Ordered in UC Medications - No data to display  Initial Impression / Assessment and Plan / UC Course  I have reviewed the triage vital signs and the nursing notes.  Pertinent labs & imaging results that were available during my care of the patient were reviewed by me and considered in my medical decision making (see chart for details).     Viral URI with cough-strep negative, COVID pending, recommending continued symptomatic and supportive care, symptoms x5 days, did opt to go ahead and proceed with sending in doxycycline to cover  sinusitis if COVID test negative and continuing to have discomfort and symptoms over the next 3 to 4 days.  Discussed strict return precautions. Patient verbalized understanding and is agreeable with plan.  Final Clinical Impressions(s) / UC Diagnoses   Final diagnoses:  Viral URI with cough     Discharge Instructions     Strep is negative, COVID test pending Ibuprofen and Tylenol for pain Continue daily Zyrtec, Mucinex Tessalon for cough Rest and fluids If not seeing any improvement over the next 3 to 4 days and COVID test negative May fill prescription for doxycycline.    ED Prescriptions    Medication Sig Dispense Auth. Provider   ibuprofen (ADVIL) 800 MG tablet Take 1 tablet (800 mg total) by mouth 3 (three) times daily. 21 tablet Erdem Naas C, PA-C   benzonatate (TESSALON) 200 MG capsule Take 1 capsule (200 mg total) by mouth 3 (three) times daily as needed for up to 7 days for cough. 28 capsule Lacora Folmer C, PA-C   doxycycline (VIBRAMYCIN) 100 MG capsule Take 1 capsule (100 mg total) by mouth 2 (two) times daily for 7 days. 14 capsule Nell Gales, [place] C, PA-C     PDMP not reviewed this encounter.   Lashaun Poch, [place] C, PA-C 06/14/20 1200

## 2020-06-14 NOTE — ED Triage Notes (Signed)
Pt presents today with 2 day sore throat and post nasal drip that has worsened since the onset. Has tried lidocaine gargle without a change. Confirms nausea last week. No v/d.

## 2020-06-15 LAB — NOVEL CORONAVIRUS, NAA: SARS-CoV-2, NAA: NOT DETECTED

## 2020-06-15 LAB — SARS-COV-2, NAA 2 DAY TAT

## 2020-06-16 LAB — CULTURE, GROUP A STREP (THRC)

## 2020-06-25 ENCOUNTER — Emergency Department (HOSPITAL_BASED_OUTPATIENT_CLINIC_OR_DEPARTMENT_OTHER)
Admission: EM | Admit: 2020-06-25 | Discharge: 2020-06-25 | Disposition: A | Discharge status: Home / Self Care | Payer: Medicaid Other | Attending: Emergency Medicine | Admitting: Emergency Medicine

## 2020-06-25 ENCOUNTER — Emergency Department (HOSPITAL_BASED_OUTPATIENT_CLINIC_OR_DEPARTMENT_OTHER): Payer: Medicaid Other

## 2020-06-25 ENCOUNTER — Encounter (HOSPITAL_BASED_OUTPATIENT_CLINIC_OR_DEPARTMENT_OTHER): Payer: Self-pay | Admitting: *Deleted

## 2020-06-25 ENCOUNTER — Other Ambulatory Visit: Payer: Self-pay

## 2020-06-25 DIAGNOSIS — S60222A Contusion of left hand, initial encounter: Secondary | ICD-10-CM | POA: Diagnosis not present

## 2020-06-25 DIAGNOSIS — Z87891 Personal history of nicotine dependence: Secondary | ICD-10-CM | POA: Insufficient documentation

## 2020-06-25 DIAGNOSIS — Y92480 Sidewalk as the place of occurrence of the external cause: Secondary | ICD-10-CM | POA: Insufficient documentation

## 2020-06-25 DIAGNOSIS — W101XXA Fall (on)(from) sidewalk curb, initial encounter: Secondary | ICD-10-CM | POA: Insufficient documentation

## 2020-06-25 DIAGNOSIS — S0031XA Abrasion of nose, initial encounter: Secondary | ICD-10-CM | POA: Diagnosis not present

## 2020-06-25 DIAGNOSIS — S8002XA Contusion of left knee, initial encounter: Secondary | ICD-10-CM | POA: Insufficient documentation

## 2020-06-25 DIAGNOSIS — S6992XA Unspecified injury of left wrist, hand and finger(s), initial encounter: Secondary | ICD-10-CM | POA: Diagnosis present

## 2020-06-25 NOTE — Discharge Instructions (Addendum)
Use the wrist splint until you are seen by the orthopedist.

## 2020-06-25 NOTE — ED Triage Notes (Signed)
Trip and fall last night on uneven ground. Reports left wrist pain and left knee pain. Ambulatory with limp. Denies LOC.

## 2020-06-25 NOTE — ED Provider Notes (Signed)
[facility] EMERGENCY DEPARTMENT Provider Note   CSN: [id number] Arrival date & time: 06/25/20  1940     History Chief Complaint  Patient presents with   Knee Pain    Daisy Hoffman is a 45 y.o. female.  Tyler Robidoux tripped on the sidewalk yesterday.  She fell forward striking her knees and hands.  She did have some abrasions over the bridge of her nose, but those have improved.  Her main areas of pain are her left wrist and her left knee.  She is right-handed.  The history is provided by the patient.  Fall This is a new problem. The current episode started yesterday. Episode frequency: one fall. Pertinent negatives include no chest pain, no abdominal pain, no headaches and no shortness of breath. The symptoms are aggravated by walking. Nothing relieves the symptoms. Treatments tried: ibuprofen. The treatment provided no relief.      Past Medical History:  Diagnosis Date   Allergy    Anemia    Anxiety    Depression    GERD (gastroesophageal reflux disease)    occ, due to gastric lapband   Headache    LGSIL (low grade squamous intraepithelial dysplasia) 11/2005, 11/2006, 02/2007, 08/2008.,02/2008   C&B LGSIL 11/2005, C&B 05/2006 PAP LGSIL A FEW HIGHER,   Restless leg syndrome    Snores     Patient Active Problem List   Diagnosis Date Noted   Chest wall pain 03/27/2019   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 10/11/2012   Depression    Anxiety    IUD    OBESITY 04/20/2006   TOBACCO ABUSE 04/20/2006    Past Surgical History:  Procedure Laterality Date   CARPAL TUNNEL RELEASE Right 04/28/2016   Procedure: RIGHT CARPAL TUNNEL RELEASE;  Surgeon: Betha Loa, MD;  Location: [facility];  Service: Orthopedics;  Laterality: Right;   CARPAL TUNNEL RELEASE Left 06/16/2016   Procedure: LEFT CARPAL TUNNEL RELEASE;  Surgeon: Betha Loa, MD;  Location: [facility];  Service: Orthopedics;  Laterality: Left;   FOOT SURGERY  1997    INTRAUTERINE DEVICE INSERTION  10/07/2013   Mirena   LAPAROSCOPIC GASTRIC BANDING  04/2008     OB History     Gravida  2   Para  1   Term  1   Preterm      AB  1   Living  1      SAB      IAB      Ectopic      Multiple      Live Births              Family History  Problem Relation Age of Onset   Diabetes Father    Hypertension Father    Osteoporosis Mother    Hypertension Mother     Social History   Tobacco Use   Smoking status: Former    Packs/day: 1.00    Years: 22.00    Pack years: 22.00    Types: Cigarettes    Start date: 01/25/2012    Quit date: 07/27/2012    Years since quitting: 7.9   Smokeless tobacco: Never   Tobacco comments:    vapes with nicotene daily  Vaping Use   Vaping Use: Every day   Substances: Nicotine, CBD, Flavoring  Substance Use Topics   Alcohol use: Yes    Comment: rarely   Drug use: No    Home Medications Prior to Admission medications  Medication Sig Start Date End Date Taking? Authorizing Provider  ALPRAZolam Prudy Feeler) 0.25 MG tablet Take 0.25 mg by mouth as needed for anxiety.  04/01/14   [provider]  buPROPion (WELLBUTRIN XL) 300 MG 24 hr tablet Take 300 mg by mouth daily.    [provider]  gabapentin (NEURONTIN) 300 MG capsule Take 300 mg by mouth at bedtime.    [provider]  ibuprofen (ADVIL) 800 MG tablet Take 1 tablet (800 mg total) by mouth 3 (three) times daily. 06/14/20   Wieters, Hallie C, PA-C  lisdexamfetamine (VYVANSE) 40 MG capsule Take 40 mg by mouth every morning.    [provider]  Melatonin 5 MG CAPS Take 5 mg by mouth at bedtime.     [provider]  rizatriptan (MAXALT-MLT) 10 MG disintegrating tablet Take 1 tablet (10 mg total) by mouth as needed for migraine. May repeat in 2 hours if needed 06/25/18   Penumalli, Glenford Bayley, MD  Specialty Vitamins Products (MAGNESIUM, AMINO ACID CHELATE,) 133 MG tablet Take 1 tablet by mouth once a week.      [provider]  topiramate (TOPAMAX) 50 MG tablet Take 1 tablet (50 mg total) by mouth 2 (two) times daily. Patient taking differently: Take 50 mg by mouth daily.  06/25/18   Penumalli, Glenford Bayley, MD  venlafaxine XR (EFFEXOR-XR) 150 MG 24 hr capsule Take 150 mg by mouth daily.    [provider]    Allergies    Omnicef [cefdinir] and Penicillins  Review of Systems   Review of Systems  Constitutional:  Negative for chills and fever.  HENT:  Negative for ear pain and sore throat.   Eyes:  Negative for pain and visual disturbance.  Respiratory:  Negative for cough and shortness of breath.   Cardiovascular:  Negative for chest pain and palpitations.  Gastrointestinal:  Negative for abdominal pain and vomiting.  Genitourinary:  Negative for dysuria and hematuria.  Musculoskeletal:  Negative for arthralgias and back pain.  Skin:  Negative for color change and rash.  Neurological:  Negative for seizures, syncope and headaches.  All other systems reviewed and are negative.  Physical Exam Updated Vital Signs BP 134/76 (BP Location: Right Arm)   Pulse 83   Temp 98 F (36.7 C) (Oral)   Resp 18   Ht 5\' 2"  (1.575 m)   Wt 99.8 kg   SpO2 100%   BMI 40.24 kg/m   Physical Exam Vitals and nursing note reviewed.  HENT:     Head: Normocephalic and atraumatic.  Eyes:     General: No scleral icterus. Pulmonary:     Effort: Pulmonary effort is normal. No respiratory distress.  Musculoskeletal:     Cervical back: Normal range of motion.     Comments: There is bruising over the thenar eminence of the left hand with tenderness to palpation over the base of the first metacarpal as well as the scaphoid on its palmar aspect.  There is mild tenderness to palpation on the volar aspect of the distal radius as well.  Wrist extension and supination are mildly limited and painful.  Distal pulses are normal.  She also has mild bruising over the left anterior patella with minimal swelling of  the prepatellar bursa.  Knee range of motion is normal with pain anteriorly and full flexion.  She also has mild pain at the lateral joint line.  The limb is warm and well-perfused.  Knee range of motion does not provoke any  pain.  Skin:    General: Skin is warm and dry.  Neurological:     Mental Status: She is alert and oriented to person, place, and time. Mental status is at baseline.  Psychiatric:        Mood and Affect: Mood normal.    ED Results / Procedures / Treatments   Labs (all labs ordered are listed, but only abnormal results are displayed) Labs Reviewed - No data to display  EKG None  Radiology DG Wrist Complete Left  Result Date: 06/25/2020 CLINICAL DATA:  Status post fall with scaphoid pain. EXAM: LEFT WRIST - COMPLETE 3+ VIEW COMPARISON:  None. FINDINGS: There is no evidence of fracture or dislocation. There is no evidence of arthropathy or other focal bone abnormality. Soft tissues are unremarkable. IMPRESSION: Negative. Electronically Signed   By: Sherian Rein M.D.   On: 06/25/2020 20:58   DG Knee Complete 4 Views Left  Result Date: 06/25/2020 CLINICAL DATA:  Status post fall with left knee pain. EXAM: LEFT KNEE - COMPLETE 4+ VIEW COMPARISON:  None. FINDINGS: No evidence of fracture, dislocation, or joint effusion. No evidence of arthropathy or other focal bone abnormality. Soft tissues are unremarkable. IMPRESSION: Negative. Electronically Signed   By: Sherian Rein M.D.   On: 06/25/2020 20:59    Procedures Procedures   Medications Ordered in ED Medications - No data to display  ED Course  I have reviewed the triage vital signs and the nursing notes.  Pertinent labs & imaging results that were available during my care of the patient were reviewed by me and considered in my medical decision making (see chart for details).    MDM Rules/Calculators/A&P                          Linden Mikes presents after a mechanical fall that happened 24 hours ago.  X-rays  are normal.  I still think there is a possibility she has sustained a scaphoid fracture, and she will be placed in a splint.  She was given instructions to follow-up with orthopedics. Final Clinical Impression(s) / ED Diagnoses Final diagnoses:  Contusion of left patella, initial encounter  Contusion of left hand, initial encounter    Rx / DC Orders ED Discharge Orders     None        Koleen Distance, MD 06/25/20 2145

## 2020-07-22 ENCOUNTER — Other Ambulatory Visit: Payer: Self-pay | Admitting: Orthopedic Surgery

## 2020-07-31 ENCOUNTER — Other Ambulatory Visit: Payer: Self-pay | Admitting: Orthopedic Surgery

## 2020-08-06 ENCOUNTER — Other Ambulatory Visit: Payer: Self-pay | Admitting: Orthopedic Surgery

## 2020-08-06 DIAGNOSIS — M25532 Pain in left wrist: Secondary | ICD-10-CM

## 2020-08-14 ENCOUNTER — Ambulatory Visit
Admission: RE | Admit: 2020-08-14 | Discharge: 2020-08-14 | Disposition: A | Discharge status: Home / Self Care | Payer: Medicaid Other | Source: Ambulatory Visit | Attending: Orthopedic Surgery | Admitting: Orthopedic Surgery

## 2020-08-14 ENCOUNTER — Other Ambulatory Visit: Payer: Self-pay

## 2020-08-14 DIAGNOSIS — M25532 Pain in left wrist: Secondary | ICD-10-CM

## 2020-09-16 DIAGNOSIS — M1812 Unilateral primary osteoarthritis of first carpometacarpal joint, left hand: Secondary | ICD-10-CM | POA: Insufficient documentation

## 2021-02-17 ENCOUNTER — Emergency Department (HOSPITAL_COMMUNITY)
Admission: EM | Admit: 2021-02-17 | Discharge: 2021-02-18 | Disposition: A | Discharge status: Other Acute Inpatient Hospital | Payer: Medicaid Other | Source: Home / Self Care | Attending: Emergency Medicine | Admitting: Emergency Medicine

## 2021-02-17 ENCOUNTER — Other Ambulatory Visit: Payer: Self-pay

## 2021-02-17 ENCOUNTER — Encounter (HOSPITAL_COMMUNITY): Payer: Self-pay | Admitting: Emergency Medicine

## 2021-02-17 DIAGNOSIS — T424X2A Poisoning by benzodiazepines, intentional self-harm, initial encounter: Secondary | ICD-10-CM | POA: Insufficient documentation

## 2021-02-17 DIAGNOSIS — R259 Unspecified abnormal involuntary movements: Secondary | ICD-10-CM | POA: Insufficient documentation

## 2021-02-17 DIAGNOSIS — Z20822 Contact with and (suspected) exposure to covid-19: Secondary | ICD-10-CM | POA: Insufficient documentation

## 2021-02-17 DIAGNOSIS — T1491XA Suicide attempt, initial encounter: Secondary | ICD-10-CM | POA: Insufficient documentation

## 2021-02-17 DIAGNOSIS — X58XXXA Exposure to other specified factors, initial encounter: Secondary | ICD-10-CM | POA: Insufficient documentation

## 2021-02-17 DIAGNOSIS — F339 Major depressive disorder, recurrent, unspecified: Secondary | ICD-10-CM | POA: Insufficient documentation

## 2021-02-17 DIAGNOSIS — T50902A Poisoning by unspecified drugs, medicaments and biological substances, intentional self-harm, initial encounter: Secondary | ICD-10-CM

## 2021-02-17 LAB — CBC WITH DIFFERENTIAL/PLATELET
Abs Immature Granulocytes: 0.01 10*3/uL (ref 0.00–0.07)
Basophils Absolute: 0 10*3/uL (ref 0.0–0.1)
Basophils Relative: 1 %
Eosinophils Absolute: 0.1 10*3/uL (ref 0.0–0.5)
Eosinophils Relative: 2 %
HCT: 41.6 % (ref 36.0–46.0)
Hemoglobin: 13.3 g/dL (ref 12.0–15.0)
Immature Granulocytes: 0 %
Lymphocytes Relative: 33 %
Lymphs Abs: 1.7 10*3/uL (ref 0.7–4.0)
MCH: 27.7 pg (ref 26.0–34.0)
MCHC: 32 g/dL (ref 30.0–36.0)
MCV: 86.7 fL (ref 80.0–100.0)
Monocytes Absolute: 0.3 10*3/uL (ref 0.1–1.0)
Monocytes Relative: 6 %
Neutro Abs: 3.1 10*3/uL (ref 1.7–7.7)
Neutrophils Relative %: 58 %
Platelets: 244 10*3/uL (ref 150–400)
RBC: 4.8 MIL/uL (ref 3.87–5.11)
RDW: 13 % (ref 11.5–15.5)
WBC: 5.3 10*3/uL (ref 4.0–10.5)
nRBC: 0 % (ref 0.0–0.2)

## 2021-02-17 LAB — COMPREHENSIVE METABOLIC PANEL
ALT: 11 U/L (ref 0–44)
AST: 14 U/L — ABNORMAL LOW (ref 15–41)
Albumin: 3.7 g/dL (ref 3.5–5.0)
Alkaline Phosphatase: 88 U/L (ref 38–126)
Anion gap: 7 (ref 5–15)
BUN: 7 mg/dL (ref 6–20)
CO2: 23 mmol/L (ref 22–32)
Calcium: 8.5 mg/dL — ABNORMAL LOW (ref 8.9–10.3)
Chloride: 107 mmol/L (ref 98–111)
Creatinine, Ser: 0.82 mg/dL (ref 0.44–1.00)
GFR, Estimated: >60 mL/min (ref >60)
Glucose, Bld: 83 mg/dL (ref 70–99)
Potassium: 4.1 mmol/L (ref 3.5–5.1)
Sodium: 137 mmol/L (ref 135–145)
Total Bilirubin: 0.7 mg/dL (ref 0.3–1.2)
Total Protein: 6.2 g/dL — ABNORMAL LOW (ref 6.5–8.1)

## 2021-02-17 LAB — HCG, QUANTITATIVE, PREGNANCY: hCG, Beta Chain, Quant, S: 1 m[IU]/mL (ref <5)

## 2021-02-17 LAB — CBG MONITORING, ED: Glucose-Capillary: 90 mg/dL (ref 70–99)

## 2021-02-17 LAB — RAPID URINE DRUG SCREEN, HOSP PERFORMED
Amphetamines: NOT DETECTED
Barbiturates: NOT DETECTED
Benzodiazepines: POSITIVE — AB
Cocaine: NOT DETECTED
Opiates: NOT DETECTED
Tetrahydrocannabinol: POSITIVE — AB

## 2021-02-17 LAB — ACETAMINOPHEN LEVEL: Acetaminophen (Tylenol), Serum: <10 ug/mL — ABNORMAL LOW (ref 10–30)

## 2021-02-17 LAB — RESP PANEL BY RT-PCR (FLU A&B, COVID) ARPGX2
Influenza A by PCR: NEGATIVE
Influenza B by PCR: NEGATIVE
SARS Coronavirus 2 by RT PCR: NEGATIVE

## 2021-02-17 LAB — ETHANOL: Alcohol, Ethyl (B): <10 mg/dL (ref <10)

## 2021-02-17 LAB — SALICYLATE LEVEL: Salicylate Lvl: <7 mg/dL — ABNORMAL LOW (ref 7.0–30.0)

## 2021-02-17 NOTE — ED Notes (Signed)
Spoke with Raynelle Fanning at [facility]. Labs, EKG and VS results were given to her. No further treatment advised. Continue to monitor in the ED.

## 2021-02-17 NOTE — ED Notes (Signed)
[facility] and spoke with [place]. They recommend the following. EKG, 4-hour post-ingestion acetaminophen level, BNP, supportive care for CNS depression, observation for 6 hours, and the pt to return to baseline in 6 hours. They expect CNS depression.

## 2021-02-17 NOTE — ED Provider Notes (Signed)
[facility]-EMERGENCY DEPT Provider Note   CSN: [id number] Arrival date & time: 02/17/21  1423     History  Chief Complaint  Patient presents with   Drug Overdose    Daisy Hoffman is a 46 y.o. female.  HPI Patient presents after acknowledged overdose.  She notes overwhelming depression, to the point where she took between 12 and 20.5 mg Xanax tablets, wrote a suicide note.  This was found by a companion and she was brought here for evaluation. She denies pain, states that she feels tired, denies weakness, denies that she feels generally sluggish.  She has seemingly been taking her other medication as directed.    Home Medications Prior to Admission medications   Medication Sig Start Date End Date Taking? Authorizing Provider  ALPRAZolam Prudy Feeler) 0.25 MG tablet Take 0.25 mg by mouth as needed for anxiety.  04/01/14   [provider]  buPROPion (WELLBUTRIN XL) 300 MG 24 hr tablet Take 300 mg by mouth daily.    [provider]  gabapentin (NEURONTIN) 300 MG capsule Take 300 mg by mouth at bedtime.    [provider]  ibuprofen (ADVIL) 800 MG tablet Take 1 tablet (800 mg total) by mouth 3 (three) times daily. 06/14/20   Wieters, Hallie C, PA-C  lisdexamfetamine (VYVANSE) 40 MG capsule Take 40 mg by mouth every morning.    [provider]  Melatonin 5 MG CAPS Take 5 mg by mouth at bedtime.     [provider]  rizatriptan (MAXALT-MLT) 10 MG disintegrating tablet Take 1 tablet (10 mg total) by mouth as needed for migraine. May repeat in 2 hours if needed 06/25/18   Penumalli, Glenford Bayley, MD  Specialty Vitamins Products (MAGNESIUM, AMINO ACID CHELATE,) 133 MG tablet Take 1 tablet by mouth once a week.     [provider]  topiramate (TOPAMAX) 50 MG tablet Take 1 tablet (50 mg total) by mouth 2 (two) times daily. Patient taking differently: Take 50 mg by mouth daily.  06/25/18   Penumalli, Glenford Bayley, MD  venlafaxine XR  (EFFEXOR-XR) 150 MG 24 hr capsule Take 150 mg by mouth daily.    [provider]      Allergies    Omnicef [cefdinir] and Penicillins    Review of Systems   Review of Systems  Constitutional:        Per HPI, otherwise negative  HENT:         Per HPI, otherwise negative  Respiratory:         Per HPI, otherwise negative  Cardiovascular:        Per HPI, otherwise negative  Gastrointestinal:  Negative for vomiting.  Endocrine:       Negative aside from HPI  Genitourinary:        Neg aside from HPI   Musculoskeletal:        Per HPI, otherwise negative  Skin: Negative.   Neurological:  Negative for syncope.  Psychiatric/Behavioral:  Positive for suicidal ideas.    Physical Exam Updated Vital Signs BP (!) 134/95 (BP Location: Left Arm)    Pulse 74    Temp 98.3 F (36.8 C) (Oral)    Resp 16    Ht 5\' 2"  (1.575 m)    Wt 93.9 kg    LMP 02/08/2021 (Approximate)    SpO2 100%    BMI 37.86 kg/m  Physical Exam Vitals and nursing note reviewed.  Constitutional:      General: She is not in  acute distress.    Appearance: She is well-developed.  HENT:     Head: Normocephalic and atraumatic.  Eyes:     Conjunctiva/sclera: Conjunctivae normal.  Cardiovascular:     Rate and Rhythm: Normal rate and regular rhythm.  Pulmonary:     Effort: Pulmonary effort is normal. No respiratory distress.     Breath sounds: Normal breath sounds. No stridor.  Abdominal:     General: There is no distension.  Skin:    General: Skin is warm and dry.  Neurological:     Mental Status: She is alert and oriented to person, place, and time.     Cranial Nerves: No cranial nerve deficit.    ED Results / Procedures / Treatments   Labs (all labs ordered are listed, but only abnormal results are displayed) Labs Reviewed  COMPREHENSIVE METABOLIC PANEL - Abnormal; Notable for the following components:      Result Value   Calcium 8.5 (*)    Total Protein 6.2 (*)    AST 14 (*)    All other components  within normal limits  RAPID URINE DRUG SCREEN, HOSP PERFORMED - Abnormal; Notable for the following components:   Benzodiazepines POSITIVE (*)    Tetrahydrocannabinol POSITIVE (*)    All other components within normal limits  SALICYLATE LEVEL  ACETAMINOPHEN LEVEL  ETHANOL  CBC WITH DIFFERENTIAL/PLATELET  HCG, QUANTITATIVE, PREGNANCY  CBG MONITORING, ED  I-STAT BETA HCG BLOOD, ED ([facility], [facility], [facility] ONLY)    EKG EKG Interpretation  Date/Time:  Wednesday February 17 2021 15:06:14 EST Ventricular Rate:  68 PR Interval:  146 QRS Duration: 80 QT Interval:  386 QTC Calculation: 410 R Axis:   -14 Text Interpretation: Normal sinus rhythm Anterolateral infarct , age undetermined Abnormal ECG Confirmed by Gerhard Munch [phone]) on 02/17/2021 3:23:32 PM  Radiology No results found.  Procedures Procedures    Medications Ordered in ED Medications - No data to display  ED Course/ Medical Decision Making/ A&P Adult female presents after suicide attempt.  With consideration of overdose Case discussed with poison control, recommendation is for continuous cardiac monitoring, repeat ECG as needed in 4 hours before medical clearance for behavioral health evaluation.  Patient's initial physical exam, vital signs reassuring, history reviewed, notable for benzodiazepine prescription.  Patient's EKG here reassuring, no evidence for early arrhythmia or other notable findings.  Patient will require additional monitoring for medical clearance.  Involuntary commitment papers executed by myself prior to signout.                         Medical Decision Making Amount and/or Complexity of Data Reviewed Independent Historian: EMS    Details: EMS reports quantity of tablets, daughter found note. External Data Reviewed: notes.    Details: Ongoing neurology evaluation for headaches. Labs: ordered. Decision-making details documented in ED Course. ECG/medicine tests: ordered and independent interpretation performed.  Decision-making details documented in ED Course.  Risk Prescription drug management. Decision regarding hospitalization. Diagnosis or treatment significantly limited by social determinants of health.  Critical Care Total time providing critical care: < 30 minutes Final Clinical Impression(s) / ED Diagnoses Final diagnoses:  Intentional overdose, initial encounter [facility])     Gerhard Munch, MD 02/17/21 1626

## 2021-02-17 NOTE — ED Notes (Signed)
Lab Recollect on lavender tube nurse aware

## 2021-02-17 NOTE — ED Triage Notes (Signed)
[facility] transported to from home and reports the following. At 11:00 AM, pt ingested 15-20 xanax 0.5 mg in an attempt to end her life. Pt daughter found her and called EMS. Pt had written a note and told EMS it was a suicide attempt.

## 2021-02-17 NOTE — ED Notes (Signed)
Patient is visiting with daughter

## 2021-02-17 NOTE — ED Provider Notes (Signed)
Care assumed from Dr. Vanita Panda while awaiting for a period of observation per poison control for the overdose.  At 7 PM she will be medically clear if she is otherwise resting and has no complaints.  7:28 PM Patient has passed her observation period with moist control.  Nursing reports she has had no other complaints and is resting comfortably and calmly.  We will place TTS consult as patient appears medically clear now.  Await psychiatric recommendations.  She is under IVC for suicide attempt.  She is awaiting med rec for home medicines to be ordered.      Daisy Hoffman, Gwenyth Allegra, MD 02/17/21 2140

## 2021-02-18 ENCOUNTER — Inpatient Hospital Stay (HOSPITAL_COMMUNITY)
Admission: AD | Admit: 2021-02-18 | Discharge: 2021-02-23 | DRG: 885 | Disposition: A | Discharge status: Home / Self Care | Payer: Medicaid Other | Source: Intra-hospital | Attending: Psychiatry | Admitting: Psychiatry

## 2021-02-18 ENCOUNTER — Encounter (HOSPITAL_COMMUNITY): Payer: Self-pay | Admitting: Nurse Practitioner

## 2021-02-18 ENCOUNTER — Other Ambulatory Visit: Payer: Self-pay

## 2021-02-18 DIAGNOSIS — G2581 Restless legs syndrome: Secondary | ICD-10-CM | POA: Diagnosis present

## 2021-02-18 DIAGNOSIS — N3001 Acute cystitis with hematuria: Secondary | ICD-10-CM | POA: Diagnosis present

## 2021-02-18 DIAGNOSIS — Z880 Allergy status to penicillin: Secondary | ICD-10-CM

## 2021-02-18 DIAGNOSIS — B001 Herpesviral vesicular dermatitis: Secondary | ICD-10-CM | POA: Diagnosis present

## 2021-02-18 DIAGNOSIS — F339 Major depressive disorder, recurrent, unspecified: Principal | ICD-10-CM | POA: Diagnosis present

## 2021-02-18 DIAGNOSIS — K5900 Constipation, unspecified: Secondary | ICD-10-CM | POA: Diagnosis present

## 2021-02-18 DIAGNOSIS — Z87440 Personal history of urinary (tract) infections: Secondary | ICD-10-CM | POA: Diagnosis not present

## 2021-02-18 DIAGNOSIS — Z20822 Contact with and (suspected) exposure to covid-19: Secondary | ICD-10-CM | POA: Diagnosis present

## 2021-02-18 DIAGNOSIS — Z87891 Personal history of nicotine dependence: Secondary | ICD-10-CM

## 2021-02-18 DIAGNOSIS — T424X2A Poisoning by benzodiazepines, intentional self-harm, initial encounter: Secondary | ICD-10-CM | POA: Diagnosis present

## 2021-02-18 DIAGNOSIS — R109 Unspecified abdominal pain: Secondary | ICD-10-CM

## 2021-02-18 DIAGNOSIS — N390 Urinary tract infection, site not specified: Secondary | ICD-10-CM | POA: Diagnosis present

## 2021-02-18 DIAGNOSIS — F332 Major depressive disorder, recurrent severe without psychotic features: Secondary | ICD-10-CM | POA: Diagnosis not present

## 2021-02-18 DIAGNOSIS — Z79899 Other long term (current) drug therapy: Secondary | ICD-10-CM | POA: Diagnosis not present

## 2021-02-18 MED ORDER — ACETAMINOPHEN 325 MG PO TABS
650.0000 mg | ORAL_TABLET | Freq: Four times a day (QID) | ORAL | Status: DC | PRN
  Administered 2021-02-22 – 2021-02-23 (×2): 650 mg via ORAL
  Filled 2021-02-18 (×2): qty 2

## 2021-02-18 MED ORDER — LISDEXAMFETAMINE DIMESYLATE 20 MG PO CAPS
40.0000 mg | ORAL_CAPSULE | ORAL | Status: DC
  Administered 2021-02-19: 40 mg via ORAL
  Filled 2021-02-18: qty 2

## 2021-02-18 MED ORDER — VALACYCLOVIR HCL 500 MG PO TABS
500.0000 mg | ORAL_TABLET | Freq: Every day | ORAL | Status: DC | PRN
  Administered 2021-02-23: 500 mg via ORAL
  Filled 2021-02-18: qty 1

## 2021-02-18 MED ORDER — VENLAFAXINE HCL ER 75 MG PO CP24
75.0000 mg | ORAL_CAPSULE | Freq: Every day | ORAL | Status: DC
  Administered 2021-02-19 – 2021-02-23 (×5): 75 mg via ORAL
  Filled 2021-02-18 (×7): qty 1

## 2021-02-18 MED ORDER — GABAPENTIN 300 MG PO CAPS
300.0000 mg | ORAL_CAPSULE | Freq: Every day | ORAL | Status: DC
  Administered 2021-02-18 – 2021-02-19 (×2): 300 mg via ORAL
  Filled 2021-02-18 (×5): qty 1

## 2021-02-18 MED ORDER — ALPRAZOLAM 0.5 MG PO TABS: 0.5000 mg | ORAL_TABLET | Freq: Two times a day (BID) | ORAL | Status: DC | PRN

## 2021-02-18 MED ORDER — MAGNESIUM HYDROXIDE 400 MG/5ML PO SUSP
30.0000 mL | Freq: Every day | ORAL | Status: DC | PRN
Start: 2021-02-18 — End: 2021-02-23
  Administered 2021-02-21: 30 mL via ORAL
  Filled 2021-02-18: qty 30

## 2021-02-18 MED ORDER — BUPROPION HCL ER (XL) 150 MG PO TB24
150.0000 mg | ORAL_TABLET | Freq: Every day | ORAL | Status: DC
  Administered 2021-02-19: 150 mg via ORAL
  Filled 2021-02-18 (×2): qty 1

## 2021-02-18 MED ORDER — ALUM & MAG HYDROXIDE-SIMETH 200-200-20 MG/5ML PO SUSP
30.0000 mL | ORAL | Status: DC | PRN
Start: 2021-02-18 — End: 2021-02-23

## 2021-02-18 NOTE — ED Notes (Signed)
Patient off unit to [facility] per provider. Patient alert, cooperative, no s/s of distress. DC information and belongings given to [employer] for transport. Patient ambulatory off unit, escorted and transported by [employer].

## 2021-02-18 NOTE — BH Assessment (Addendum)
[facility] Assessment Progress Note   Per Caryn Bee, NP , this pt requires psychiatric hospitalization.  Linsey, RN, [facility] has assigned pt to [facility] Rm 307-1 to the service of Dr Sherron Flemings in anticipation of a planned discharge on the unit.  Linsey will call when [facility] is ready to receive pt.  Pt presents under IVC initiated by pt's brother, and upheld by EDP Gerhard Munch, MD, and IVC documents have been faxed to [facility].  EDP Virgina Norfolk, DO and pt's nurse, Waynetta Sandy, have been notified, and Waynetta Sandy agrees to call report to [phone].  Pt is to be transported via [employer] when the time comes.   Doylene Canning, [place] [facility] Coordinator [phone]  Addendum:  [facility] will be ready to receive pt at 15:00, per Linsey.  Parties noted above have been notified.  Doylene Canning, [place] [facility] Coordinator [phone]

## 2021-02-18 NOTE — ED Notes (Signed)
TTS consult in progress. °

## 2021-02-18 NOTE — Progress Notes (Signed)
Patient ID: Daisy Hoffman, female   DOB: 17-Aug-1975, 46 y.o.   MRN: [id number]   Initial Treatment Plan 02/18/2021 7:10 PM Daisy Hoffman [id number]    PATIENT STRESSORS: Financial difficulties   Loss of home     PATIENT STRENGTHS: Capable of independent living  Supportive family/friends  Work skills    PATIENT IDENTIFIED PROBLEMS: Suicidal ideation  Depression  Anxiety  Financial problems               DISCHARGE CRITERIA:  Ability to meet basic life and health needs Improved stabilization in mood, thinking, and/or behavior Reduction of life-threatening or endangering symptoms to within safe limits Verbal commitment to aftercare and medication compliance  PRELIMINARY DISCHARGE PLAN: Outpatient therapy Participate in family therapy Return to previous living arrangement  PATIENT/FAMILY INVOLVEMENT: This treatment plan has been presented to and reviewed with the patient, Daisy Hoffman, and/or family member.  The patient and family have been given the opportunity to ask questions and make suggestions.  Harriet Masson, RN 02/18/2021, 7:10 PM

## 2021-02-18 NOTE — Progress Notes (Addendum)
Patient ID: Daisy Hoffman, female   DOB: Jan 16, 1976, 46 y.o.   MRN: [id number]   Pt ambulatory, alert, and oriented during [facility] admission process. Pt denies SI/HI and AVH on admission. Education, support, reassurance, and encouragement provided, q15 minute safety checks initiated. Pt's belongings in locker #4 and belongings sheet signed. Pt oriented to the unit and given admission packet. Pt verbally contracts for safety and remains safe on the unit.

## 2021-02-18 NOTE — ED Provider Notes (Addendum)
Patient has been accepted to behavioral health with Dr. Sherron Flemings.  Transfer likely to happen around 3 PM.   Virgina Norfolk, DO 02/18/21 1222    Kadeja Granada, DO 02/18/21 1222

## 2021-02-18 NOTE — BH Assessment (Signed)
Comprehensive Clinical Assessment (CCA) Note  02/18/2021 [name] TQ:[id number]  Disposition: [name], PA, patient meet inpatient criteria. Disposition SW to secure placement in the AM. No AC at [facility] to review for placement. I-Li, RN, informed of disposition.  The patient demonstrates the following risk factors for suicide: Chronic risk factors for suicide include: psychiatric disorder of depression . Acute risk factors for suicide include: loss (financial, interpersonal, professional). Protective factors for this patient include: positive social support, responsibility to others (children, family), coping skills, and hope for the future. Considering these factors, the overall suicide risk at this point appears to be high. Patient is not appropriate for outpatient follow up.  [place] [facility] from 02/17/2021 in [facility]PT ED from 06/25/2020 in [facility] from 06/14/2020 in [facility] [place] Error: Q2 is Yes, you must answer 3, 4, and 5 No Risk No Risk      Daisy Hoffman is a 46 year old female presenting under IVC to [place] due to attempted overdose on 15-20 Xanax 0.5mg  in attempt to end her life. Patient daughter found her and called EMS. Patient had a written note and told EMS it was a suicide attempt. Patient stated "I took the pills and I was not wanting to wake back up". Patient reported onset of SI was 2 weeks ago. Patient reported stressors/triggers includes patients house is in foreclosure and behind on majority of bills. Patient reported worsening depressive symptoms. Patient denied prior psych hospitalizations, suicide attempts and self-harming behaviors. Patient reported currently seeing Joe Hues, NP at [facility] for medication management. Patient reported medications are working, however she still has low days. Patient is and 17 year old daughter lives  together. Patient is currently employed as a Education administrator and reported work-related stressors. Patient denied acces to guns. Patient denied HI, psychosis and alcohol/drug usage. Patient cooperative during assessment.   Chief Complaint:  Chief Complaint  Patient presents with   Drug Overdose   Visit Diagnosis:  Major Depressive Disorder    CCA Screening, Triage and Referral (STR)  Patient Reported Information How did you hear about [place]? Self  What Is the Reason for Your Visit/Call Today? Attempted overdose  How Long Has This Been Causing You Problems? 1 wk - 1 month  What Do You Feel Would Help You the Most Today? Treatment for Depression or other mood problem   Have You Recently Had Any Thoughts About Hurting Yourself? Yes  Are You Planning to Commit Suicide/Harm Yourself At This time? No data recorded  Have you Recently Had Thoughts About [place]? No data recorded Are You Planning to Harm Someone at This Time? No  Explanation: No data recorded  Have You Used Any Alcohol or Drugs in the Past 24 Hours? No  How Long Ago Did You Use Drugs or Alcohol? No data recorded What Did You Use and How Much? No data recorded  Do You Currently Have a Therapist/Psychiatrist? No  Name of Therapist/Psychiatrist: No data recorded  Have You Been Recently Discharged From Any Office Practice or Programs? No  Explanation of Discharge From Practice/Program: No data recorded    CCA Screening Triage Referral Assessment Type of Contact: Tele-Assessment  Telemedicine Service Delivery:   Is this Initial or Reassessment? Initial Assessment  Date Telepsych consult ordered in [facility]:  02/18/21  Time Telepsych consult ordered in [facility]:  1926  [facility] Assessment[facility]  Provider  [facility]   Collateral Involvement: none reporte   Does Patient Have a Automotive engineerCourt Appointed Legal Guardian? No data recorded Name and Contact of Legal Guardian: No data  recorded If Minor and Not Living with Parent(s), Who has Custody? No data recorded Is CPS involved or ever been involved? Never  Is APS involved or ever been involved? Never   Patient Determined To Be At Risk for Harm To Self or Others Based on Review of Patient Reported Information or Presenting Complaint? No data recorded Method: No data recorded Availability of Means: No data recorded Intent: No data recorded Notification Required: No data recorded Additional Information for Danger to Others Potential: No data recorded Additional Comments for Danger to Others Potential: No data recorded Are There Guns or Other Weapons in Your Home? No data recorded Types of Guns/Weapons: No data recorded Are These Weapons Safely Secured?                            No data recorded Who Could Verify You Are Able To Have These Secured: No data recorded Do You Have any Outstanding Charges, Pending Court Dates, Parole/Probation? No data recorded Contacted To Inform of Risk of Harm To Self or Others: No data recorded   Does Patient Present under Involuntary Commitment? No  IVC Papers Initial File Date: No data recorded  [place]   Patient Currently Receiving the Following Services: No data recorded  Determination of Need: Emergent (2 hours)   Options For Referral: Outpatient Therapy; Inpatient Hospitalization; Medication Management     CCA Biopsychosocial Patient Reported Schizophrenia/Schizoaffective Diagnosis in Past: No data recorded  Strengths: self-awareness   Mental Health Symptoms Depression:   Hopelessness; Tearfulness; Fatigue; Sleep (too much or little); Worthlessness; Increase/decrease in appetite; Change in energy/activity   Duration of Depressive symptoms:  Duration of Depressive Symptoms: Less than two weeks   Mania:   None   Anxiety:    Worrying; Tension; Sleep; Restlessness   Psychosis:   None   Duration of Psychotic symptoms:    Trauma:    None   Obsessions:   None   Compulsions:   None   Inattention:   None   Hyperactivity/Impulsivity:   None   Oppositional/Defiant Behaviors:   None   Emotional Irregularity:   None   Other Mood/Personality Symptoms:  No data recorded   Mental Status Exam Appearance and self-care  Stature:   Average   Weight:   Average weight   Clothing:   Neat/clean   Grooming:   Normal   Cosmetic use:   None   Posture/gait:   Normal   Motor activity:   Not Remarkable   Sensorium  Attention:   Normal   Concentration:   Normal   Orientation:   X5   Recall/memory:   Normal   Affect and Mood  Affect:   Appropriate; Depressed   Mood:   Anxious; Depressed; Hopeless   Relating  Eye contact:   Normal   Facial expression:   Depressed; Sad; Anxious   Attitude toward examiner:   Cooperative   Thought and Language  Speech flow:  Normal   Thought content:   Appropriate to Mood and Circumstances   Preoccupation:   None   Hallucinations:   None   Organization:  No data recorded  Affiliated Computer ServicesExecutive Functions  Fund of Knowledge:   Average   Intelligence:   Average   Abstraction:   Normal  Judgement:   Normal   Reality Testing:   Realistic   Insight:   Fair   Decision Making:   Impulsive   Social Functioning  Social Maturity:  No data recorded  Social Judgement:   Heedless   Stress  Stressors:   Housing; Office manager Ability:   Exhausted; Overwhelmed   Skill Deficits:   Decision making   Supports:   Family; Friends/Service system     Religion: Religion/Spirituality Are You A Religious Person?:  Industrial/product designer)  Leisure/Recreation: Leisure / Recreation Do You Have Hobbies?: Yes Leisure and Hobbies: Tree surgeon: Exercise/Diet Do You Exercise?: No Do You Follow a Special Diet?:  (uta) Do You Have Any Trouble Sleeping?: Yes Explanation of Sleeping Difficulties: 5-6   CCA  Employment/Education 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 Job has Been Impacted by Current Illness: No  Education: Education Is Patient Currently Attending School?: No Last Grade Completed: 14 Did You Product manager?: Yes What Type of College Degree Do you Have?: Associates Degree Did You Have An Individualized Education Program (IIEP): No Did You Have Any Difficulty At School?: No Patient's Education Has Been Impacted by Current Illness: No   CCA Family/Childhood History Family and Relationship History: Family history Marital status: Separated Separated, when?: [place] What types of issues is patient dealing with in the relationship?: uta Additional relationship information: uta Does patient have children?: Yes How many children?: 1 How is patient's relationship with their children?: good  Childhood History:  Childhood History By whom was/is the patient raised?: Mother Did patient suffer any verbal/emotional/physical/sexual abuse as a child?: No Did patient suffer from severe childhood neglect?: No Has patient ever been sexually abused/assaulted/raped as an adolescent or adult?: No Was the patient ever a victim of a crime or a disaster?: No  Child/Adolescent Assessment:     CCA Substance Use Alcohol/Drug Use: Alcohol / Drug Use Pain Medications: see MAR Prescriptions: see MAR Over the Counter: see MAR History of alcohol / drug use?: No history of alcohol / drug abuse                         ASAM's:  Six Dimensions of Multidimensional Assessment  Dimension 1:  Acute Intoxication and/or Withdrawal Potential:      Dimension 2:  Biomedical Conditions and Complications:      Dimension 3:  Emotional, Behavioral, or Cognitive Conditions and Complications:     Dimension 4:  Readiness to Change:     Dimension 5:  Relapse, Continued use, or Continued Problem Potential:     Dimension 6:  Recovery/Living Environment:     ASAM Severity  Score:    ASAM Recommended Level of Treatment:     Substance use Disorder (SUD)    Recommendations for Services/Supports/Treatments:    Discharge Disposition:    DSM5 Diagnoses: Patient Active Problem List   Diagnosis Date Noted   Chest wall pain 03/27/2019   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 10/11/2012   Depression    Anxiety    IUD    OBESITY 04/20/2006   TOBACCO ABUSE 04/20/2006     Referrals to Alternative Service(s): Referred to Alternative Service(s):   Place:   Date:   Time:    Referred to Alternative Service(s):   Place:   Date:   Time:    Referred to Alternative Service(s):   Place:   Date:   Time:    Referred to Alternative Service(s):   Place:  Date:   Time:     Venora Maples, [facility]

## 2021-02-19 ENCOUNTER — Inpatient Hospital Stay (HOSPITAL_COMMUNITY): Payer: Medicaid Other

## 2021-02-19 LAB — CBC WITH DIFFERENTIAL/PLATELET
Abs Immature Granulocytes: 0.03 10*3/uL (ref 0.00–0.07)
Basophils Absolute: 0 10*3/uL (ref 0.0–0.1)
Basophils Relative: 0 %
Eosinophils Absolute: 0.1 10*3/uL (ref 0.0–0.5)
Eosinophils Relative: 1 %
HCT: 45.5 % (ref 36.0–46.0)
Hemoglobin: 14.6 g/dL (ref 12.0–15.0)
Immature Granulocytes: 0 %
Lymphocytes Relative: 16 %
Lymphs Abs: 1.6 10*3/uL (ref 0.7–4.0)
MCH: 27.6 pg (ref 26.0–34.0)
MCHC: 32.1 g/dL (ref 30.0–36.0)
MCV: 86 fL (ref 80.0–100.0)
Monocytes Absolute: 0.4 10*3/uL (ref 0.1–1.0)
Monocytes Relative: 4 %
Neutro Abs: 8 10*3/uL — ABNORMAL HIGH (ref 1.7–7.7)
Neutrophils Relative %: 79 %
Platelets: 252 10*3/uL (ref 150–400)
RBC: 5.29 MIL/uL — ABNORMAL HIGH (ref 3.87–5.11)
RDW: 12.9 % (ref 11.5–15.5)
WBC: 10.2 10*3/uL (ref 4.0–10.5)
nRBC: 0 % (ref 0.0–0.2)

## 2021-02-19 LAB — LIPID PANEL
Cholesterol: 170 mg/dL (ref 0–200)
HDL: 43 mg/dL (ref >40)
LDL Cholesterol: 109 mg/dL — ABNORMAL HIGH (ref 0–99)
Total CHOL/HDL Ratio: 4 RATIO
Triglycerides: 90 mg/dL (ref <150)
VLDL: 18 mg/dL (ref 0–40)

## 2021-02-19 LAB — BASIC METABOLIC PANEL
Anion gap: 8 (ref 5–15)
BUN: 13 mg/dL (ref 6–20)
CO2: 27 mmol/L (ref 22–32)
Calcium: 9.1 mg/dL (ref 8.9–10.3)
Chloride: 101 mmol/L (ref 98–111)
Creatinine, Ser: 0.94 mg/dL (ref 0.44–1.00)
GFR, Estimated: >60 mL/min (ref >60)
Glucose, Bld: 90 mg/dL (ref 70–99)
Potassium: 4.3 mmol/L (ref 3.5–5.1)
Sodium: 136 mmol/L (ref 135–145)

## 2021-02-19 LAB — URINALYSIS, ROUTINE W REFLEX MICROSCOPIC
Bilirubin Urine: NEGATIVE
Glucose, UA: NEGATIVE mg/dL
Ketones, ur: 20 mg/dL — AB
Nitrite: NEGATIVE
Protein, ur: 100 mg/dL — AB
RBC / HPF: >50 RBC/hpf — ABNORMAL HIGH (ref 0–5)
Specific Gravity, Urine: 1.025 (ref 1.005–1.030)
pH: 6 (ref 5.0–8.0)

## 2021-02-19 LAB — HEMOGLOBIN A1C
Hgb A1c MFr Bld: 4.6 % — ABNORMAL LOW (ref 4.8–5.6)
Mean Plasma Glucose: 85.32 mg/dL

## 2021-02-19 LAB — TSH: TSH: 3.385 u[IU]/mL (ref 0.350–4.500)

## 2021-02-19 MED ORDER — ONDANSETRON 4 MG PO TBDP: 4.0000 mg | ORAL_TABLET | Freq: Four times a day (QID) | ORAL | Status: AC | PRN

## 2021-02-19 MED ORDER — LOPERAMIDE HCL 2 MG PO CAPS: 2.0000 mg | ORAL_CAPSULE | ORAL | Status: AC | PRN

## 2021-02-19 MED ORDER — PHENAZOPYRIDINE HCL 200 MG PO TABS: 200.0000 mg | ORAL_TABLET | Freq: Three times a day (TID) | ORAL | 0 refills | Status: DC

## 2021-02-19 MED ORDER — NITROFURANTOIN MONOHYD MACRO 100 MG PO CAPS
100.0000 mg | ORAL_CAPSULE | Freq: Once | ORAL | Status: AC
Start: 2021-02-19 — End: 2021-02-19
  Administered 2021-02-19: 100 mg via ORAL
  Filled 2021-02-19: qty 1

## 2021-02-19 MED ORDER — SODIUM CHLORIDE 0.9 % IV BOLUS
1000.0000 mL | Freq: Once | INTRAVENOUS | Status: AC
  Administered 2021-02-19: 1000 mL via INTRAVENOUS

## 2021-02-19 MED ORDER — NITROFURANTOIN MONOHYD MACRO 100 MG PO CAPS: 100.0000 mg | ORAL_CAPSULE | Freq: Two times a day (BID) | ORAL | 0 refills | Status: DC

## 2021-02-19 MED ORDER — WHITE PETROLATUM EX OINT
TOPICAL_OINTMENT | CUTANEOUS | Status: AC
  Filled 2021-02-19: qty 5

## 2021-02-19 MED ORDER — PHENAZOPYRIDINE HCL 200 MG PO TABS
200.0000 mg | ORAL_TABLET | Freq: Once | ORAL | Status: AC
  Administered 2021-02-19: 200 mg via ORAL
  Filled 2021-02-19: qty 1

## 2021-02-19 MED ORDER — HYDROXYZINE HCL 25 MG PO TABS: 25.0000 mg | ORAL_TABLET | Freq: Four times a day (QID) | ORAL | Status: AC | PRN

## 2021-02-19 MED ORDER — LORAZEPAM 1 MG PO TABS: 1.0000 mg | ORAL_TABLET | Freq: Four times a day (QID) | ORAL | Status: AC | PRN

## 2021-02-19 NOTE — BHH Group Notes (Signed)
Pt  attended and contributed to group discussion. °

## 2021-02-19 NOTE — Progress Notes (Signed)
°   02/19/21 0900  Psych Admission Type (Psych Patients Only)  Admission Status Involuntary  Psychosocial Assessment  Patient Complaints Crying spells;Anxiety  Eye Contact Fair  Facial Expression Sad  Affect Anxious;Depressed  Speech Soft;Logical/coherent  Interaction Assertive  Motor Activity Slow  Appearance/Hygiene Unremarkable  Behavior Characteristics Cooperative  Mood Depressed;Anxious  Thought Process  Coherency WDL  Content WDL  Delusions None reported or observed  Perception WDL  Hallucination None reported or observed  Judgment WDL  Confusion None  Danger to Self  Current suicidal ideation? Denies  Danger to Others  Danger to Others None reported or observed

## 2021-02-19 NOTE — ED Notes (Signed)
Pt presents from [facility] with a mental health sitter at the bedside. Per the pt, she has been unable to urinate since last night. She states "she got a few drops of urine out this morning and there was blood." LNMP one week ago. She states she has right-sided flank pain and has suprapubic abd pain. She states she was sent here to the ED for her sx and to r/o kidney stone and/or "bladder paralysis from Alprazolam." Pt states she has been taking Alprazolam "for years but took too many on Wednesday." Pt denies a hx of kidney stones. She reports her dysuria began yesterday.

## 2021-02-19 NOTE — Discharge Instructions (Signed)
You were seen in the emergency department for evaluation of right flank pain and urinary symptoms.  Your CAT scan did not show any obvious abnormalities.  Your urine was possibly infected and was sent for culture.  We are starting you on antibiotics.  Please drink plenty of fluids.  Follow-up with your doctor and return to the emergency department if any worsening or concerning symptoms

## 2021-02-19 NOTE — ED Notes (Addendum)
Bladder scan performed by ED tech multiple times showed a bladder volume of 9mL's urine in the bladder. Dr.Butler notified and aware.

## 2021-02-19 NOTE — Progress Notes (Signed)
°   02/18/21 2000  Psych Admission Type (Psych Patients Only)  Admission Status Involuntary  Psychosocial Assessment  Patient Complaints Anxiety  Eye Contact Brief  Facial Expression Blank;Sad  Affect Anxious;Depressed;Preoccupied  Speech Soft;Logical/coherent  Interaction Assertive  Motor Activity Slow  Appearance/Hygiene Improved  Behavior Characteristics Cooperative;Anxious  Mood Depressed  Thought Process  Coherency WDL  Content WDL  Delusions None reported or observed  Perception WDL  Hallucination None reported or observed  Judgment WDL  Confusion None  Danger to Self  Current suicidal ideation? Denies  Danger to Others  Danger to Others None reported or observed

## 2021-02-19 NOTE — Progress Notes (Signed)
Patient did attend the evening speaker AA meeting.  

## 2021-02-19 NOTE — ED Provider Notes (Signed)
[facility]-EMERGENCY DEPT Provider Note   CSN: [id number] Arrival date & time: 02/19/21  1102     History  Chief Complaint  Patient presents with   mdd    Daisy Hoffman is a 46 y.o. female.  She is currently inpatient at [facility] after a benzodiazepine overdose.  She started with having some dysuria last evening with some hematuria and urinary frequency.  Also having right-sided flank pain.  Symptoms continued today and so patient transferred to ED for further evaluation.  No fevers or chills.  No vaginal discharge.  No nausea or vomiting.  The history is provided by the patient.  Dysuria Pain quality:  Sharp and stabbing Pain severity:  Moderate Onset quality:  Gradual Duration:  2 days Timing:  Intermittent Progression:  Unchanged Chronicity:  New Recent urinary tract infections: no   Relieved by:  None tried Worsened by:  Nothing Ineffective treatments:  None tried Urinary symptoms: frequent urination and hematuria   Associated symptoms: abdominal pain and flank pain   Associated symptoms: no fever and no vaginal discharge       Home Medications Prior to Admission medications   Medication Sig Start Date End Date Taking? Authorizing Provider  ALPRAZolam Prudy Feeler) 0.5 MG tablet Take 0.5 mg by mouth 2 (two) times daily as needed for anxiety or sleep. 04/01/14   [provider]  buPROPion (WELLBUTRIN XL) 150 MG 24 hr tablet Take 150 mg by mouth daily.    [provider]  gabapentin (NEURONTIN) 300 MG capsule Take 300 mg by mouth at bedtime.    [provider]  lisdexamfetamine (VYVANSE) 40 MG capsule Take 40 mg by mouth every morning.    [provider]  Specialty Vitamins Products (MAGNESIUM, AMINO ACID CHELATE,) 133 MG tablet Take 1 tablet by mouth at bedtime.    [provider]  valACYclovir (VALTREX) 500 MG tablet Take 500 mg by mouth daily as needed (for cold sores until clear). 02/06/21   [provider]  Venlafaxine HCl 75 MG TB24 Take 75 mg by mouth daily.    [provider]  ZOVIRAX 5 % Apply 1 application topically as needed (for cold sores). 01/26/21   [provider]      Allergies    Omnicef [cefdinir] and Penicillins    Review of Systems   Review of Systems  Constitutional:  Negative for fever.  HENT:  Negative for sore throat.   Eyes:  Negative for visual disturbance.  Respiratory:  Negative for shortness of breath.   Cardiovascular:  Negative for chest pain.  Gastrointestinal:  Positive for abdominal pain.  Genitourinary:  Positive for dysuria and flank pain. Negative for vaginal discharge.  Musculoskeletal:  Positive for back pain.  Skin:  Negative for rash.  Neurological:  Negative for headaches.   Physical Exam Updated Vital Signs BP (!) 138/103 (BP Location: Left Arm)    Pulse (!) 106    Temp 98.3 F (36.8 C) (Oral)    Resp 18    Ht 5\' 2"  (1.575 m)    Wt 95.3 kg    LMP 02/08/2021 (Approximate)    SpO2 99%    BMI 38.41 kg/m  Physical Exam Vitals and nursing note reviewed.  Constitutional:      General: She is not in acute distress.    Appearance: Normal appearance. She is well-developed.  HENT:     Head: Normocephalic and atraumatic.  Eyes:     Conjunctiva/sclera: Conjunctivae normal.  Cardiovascular:  Rate and Rhythm: Normal rate and regular rhythm.     Heart sounds: No murmur heard. Pulmonary:     Effort: Pulmonary effort is normal. No respiratory distress.     Breath sounds: Normal breath sounds.  Abdominal:     Palpations: Abdomen is soft.     Tenderness: There is abdominal tenderness (suprapubic). There is right CVA tenderness. There is no guarding or rebound.  Musculoskeletal:        General: No swelling.     Cervical back: Neck supple.  Skin:    General: Skin is warm and dry.     Capillary Refill: Capillary refill takes less than 2 seconds.  Neurological:     General: No focal deficit present.     Mental Status: She is  alert.    ED Results / Procedures / Treatments   Labs (all labs ordered are listed, but only abnormal results are displayed) Labs Reviewed  LIPID PANEL - Abnormal; Notable for the following components:      Result Value   LDL Cholesterol 109 (*)    All other components within normal limits  HEMOGLOBIN A1C - Abnormal; Notable for the following components:   Hgb A1c MFr Bld 4.6 (*)    All other components within normal limits  CBC WITH DIFFERENTIAL/PLATELET - Abnormal; Notable for the following components:   RBC 5.29 (*)    Neutro Abs 8.0 (*)    All other components within normal limits  URINALYSIS, ROUTINE W REFLEX MICROSCOPIC - Abnormal; Notable for the following components:   Hgb urine dipstick LARGE (*)    Ketones, ur 20 (*)    Protein, ur 100 (*)    Leukocytes,Ua SMALL (*)    RBC / HPF >50 (*)    Bacteria, UA RARE (*)    Non Squamous Epithelial 0-5 (*)    All other components within normal limits  URINE CULTURE  TSH  BASIC METABOLIC PANEL    EKG None  Radiology CT Renal Stone Study  Result Date: 02/19/2021 CLINICAL DATA:  A 46 year old female presents for evaluation of flank pain and suspected kidney stone, reportedly with inability to urinate. EXAM: CT ABDOMEN AND PELVIS WITHOUT CONTRAST TECHNIQUE: Multidetector CT imaging of the abdomen and pelvis was performed following the standard protocol without IV contrast. RADIATION DOSE REDUCTION: This exam was performed according to the departmental dose-optimization program which includes automated exposure control, adjustment of the mA and/or kV according to patient size and/or use of iterative reconstruction technique. COMPARISON:  Comparison made with October 10, 2012. FINDINGS: Lower chest: Lung bases are clear.  No effusion.  No consolidation. Hepatobiliary: No focal, suspicious hepatic lesion on noncontrast imaging. No pericholecystic stranding or gross biliary duct distension. Pancreas: Normal without signs of  inflammation. Spleen: Normal size and contour. Adrenals/Urinary Tract: Adrenal glands are normal. No perinephric stranding. No hydronephrosis. No renal or ureteric calculi. No perivesical stranding. Stomach/Bowel: Post gastric banding. No stranding about the esophagogastric junction or proximal stomach. Similar appearance of the band when compared to prior imaging. No acute gastrointestinal process without stranding or bowel dilation. Normal appendix. Vascular/Lymphatic: Aorta with smooth contours. IVC with smooth contours. No aneurysmal dilation of the abdominal aorta. There is no gastrohepatic or hepatoduodenal ligament lymphadenopathy. No retroperitoneal or mesenteric lymphadenopathy. No pelvic sidewall lymphadenopathy. Limited assessment of vascular structures without intravenous contrast. Reproductive: IUD in place.  No adnexal mass. Other: No ascites. No pneumoperitoneum. No stranding along the course of the catheter for the gastric band or about the port  site in the abdomen. Small umbilical hernia contains fat. Musculoskeletal: No acute bone finding. No destructive bone process. Spinal degenerative changes. IMPRESSION: 1. No acute intra-abdominal abnormality. Specifically, no renal or ureteric calculi. 2. Normal appendix. 3. No pericholecystic stranding. 4. Post gastric banding. Similar appearance of the band when compared to prior imaging. Electronically Signed   By: Zetta Bills M.D.   On: 02/19/2021 12:50    Procedures Procedures    Medications Ordered in ED Medications  acetaminophen (TYLENOL) tablet 650 mg (has no administration in time range)  alum & mag hydroxide-simeth (MAALOX/MYLANTA) 200-200-20 MG/5ML suspension 30 mL (has no administration in time range)  magnesium hydroxide (MILK OF MAGNESIA) suspension 30 mL (has no administration in time range)  ALPRAZolam (XANAX) tablet 0.5 mg (has no administration in time range)  gabapentin (NEURONTIN) capsule 300 mg (300 mg Oral Given 02/18/21  2124)  lisdexamfetamine (VYVANSE) capsule 40 mg (40 mg Oral Given 02/19/21 0644)  venlafaxine XR (EFFEXOR-XR) 24 hr capsule 75 mg (75 mg Oral Given 02/19/21 0818)  valACYclovir (VALTREX) tablet 500 mg (has no administration in time range)  buPROPion (WELLBUTRIN XL) 24 hr tablet 150 mg (150 mg Oral Given 02/19/21 0818)  white petrolatum (VASELINE) gel (has no administration in time range)  sodium chloride 0.9 % bolus 1,000 mL (has no administration in time range)    ED Course/ Medical Decision Making/ A&P Clinical Course as of 02/19/21 1739  Fri Feb 19, 2021  1350 Patient's urine showing possible signs of infection.  She said usually takes Macrobid for her UTIs.  We will also prescribe her some [MB]    Clinical Course User Index [MB] [name] Rasmussen, MD                           Medical Decision Making Amount and/or Complexity of Data Reviewed Labs: ordered. Radiology: ordered.  Risk Prescription drug management.  This patient complains of dysuria frequency hematuria flank pain; this involves an extensive number of treatment Options and is a complaint that carries with it a high risk of complications and Morbidity. The differential includes pyelonephritis, UTI,, renal mass, renal colic  I ordered, reviewed and interpreted labs, which included CBC with normal white count normal hemoglobin, chemistries normal, urinalysis with hematuria white cells rare bacteria nitrite negative I ordered medication oral antibiotics and Pyridium, IV fluids I ordered imaging studies which included CT renal and I independently    visualized and interpreted imaging which showed no acute findings Previous records obtained and reviewed in epic including prior ED psychiatry notes After the interventions stated above, I reevaluated the patient and found patient to be nontoxic-appearing.  She remains tachycardic although otherwise in no distress.  Do not feel she needs admission at this time and will be returning  back to inpatient psychiatry.  Return instructions discussed          Final Clinical Impression(s) / ED Diagnoses Final diagnoses:  Acute cystitis with hematuria  Right flank pain    Rx / DC Orders ED Discharge Orders          Ordered    phenazopyridine (PYRIDIUM) 200 MG tablet  3 times daily        02/19/21 1354    nitrofurantoin, macrocrystal-monohydrate, (MACROBID) 100 MG capsule  2 times daily        02/19/21 1354              [name] Rasmussen, MD 02/19/21 [phone]

## 2021-02-19 NOTE — ED Notes (Addendum)
Pt departed the dept with ED d/c instructions and printed prescription. Pt ambulatory with Behavioral Health tech back to [facility] to wait for [facility] for transportation back to [facility], which [employer] stated she would set-up.

## 2021-02-19 NOTE — H&P (Addendum)
Psychiatric Admission Assessment Adult  Patient Identification: Daisy Hoffman MRN:  [id number] Date of Evaluation:  02/19/2021 Chief Complaint:  Right flank pain [R10.9] Acute cystitis with hematuria [N30.01] MDD (major depressive disorder), recurrent episode ([place]) [F33.9] Principal Diagnosis: MDD (major depressive disorder), recurrent episode ([place]) Diagnosis:  Principal Problem:   MDD (major depressive disorder), recurrent episode ([place])  History of Present Illness:  Daisy Hoffman is a 46 yr old female who presented on 2/1 to [facility] after a Suicide Attempt via OD (12-20 tablets of 5 mg Xanax).  PPHx is unknown at this time.   Attempted to interview the patient this morning.  She reported significant pain in her bladder and back.  She reports that when she woke up yesterday she began to have pain and began to urinate less and less to the point where this morning she was only able to output a few drops.  She reports that these drops were bloody.  She reports she has significant increase in urge to urinate but not been able to.  She reports that her brother does have a history of kidney stones but that she has never had a kidney stone.  She does report that her and her sister have had frequent UTIs.  She reports that her daughter did have a history of Seizures and required rectal benzodiazepine.  She states after using it her daughter was unable to urinate and required cathing.    Discussed that given her symptoms there is concern for obstructing kidney stone or Pyelonephritis/Cystitis.  Discussed she would need to be sent to [facility] for evaluation and work up.  After this if she is admitted to [facility] she will be followed by the Psych Consult team or if she is cleared she will return here.  She reported understanding and had no other concerns at present.    Contacted [facility] and spoke with Dr. Melina Copa.  Discussed the patients symptoms and concern for Obstructing Stone, Pyelonephritis/Cystitis, or Drug Induced  Urinary Retention given her Overdose attempt.  He accepted the patient.  Patient was transferred to [facility] for work up.   Associated Signs/Symptoms: Depression Symptoms:  unable to assess Duration of Depression Symptoms: Less than two weeks  (Hypo) Manic Symptoms:  unable to assess Anxiety Symptoms:  unable to assess Psychotic Symptoms:  unable to assess PTSD Symptoms: unable to assess Total Time spent with patient: 20 minutes  Past Psychiatric History: unable to assess  Is the patient at risk to self? Yes.    Has the patient been a risk to self in the past 6 months? unable to assess  Has the patient been a risk to self within the distant past? unable to assess  Is the patient a risk to others? unable to assess  Has the patient been a risk to others in the past 6 months? unable to assess  Has the patient been a risk to others within the distant past? unable to assess   Prior Inpatient Therapy:   Prior Outpatient Therapy:    Alcohol Screening: 1. How often do you have a drink containing alcohol?: Monthly or less 2. How many drinks containing alcohol do you have on a typical day when you are drinking?: 1 or 2 3. How often do you have six or more drinks on one occasion?: Never AUDIT-C Score: 1 Substance Abuse History in the last 12 months:  unable to assess Consequences of Substance Abuse: unable to assess Previous Psychotropic Medications: unable to assess Psychological Evaluations: unable to assess Past Medical  History:  Past Medical History:  Diagnosis Date   Allergy    Anemia    Anxiety    Depression    GERD (gastroesophageal reflux disease)    occ, due to gastric lapband   Headache    LGSIL (low grade squamous intraepithelial dysplasia) 11/2005, 11/2006, 02/2007, 08/2008.,02/2008   C&B LGSIL 11/2005, C&B 05/2006 PAP LGSIL A FEW HIGHER,   Restless leg syndrome    Snores     Past Surgical History:  Procedure Laterality Date   CARPAL TUNNEL RELEASE Right 04/28/2016    Procedure: RIGHT CARPAL TUNNEL RELEASE;  Surgeon: Leanora Cover, MD;  Location: [place];  Service: Orthopedics;  Laterality: Right;   CARPAL TUNNEL RELEASE Left 06/16/2016   Procedure: LEFT CARPAL TUNNEL RELEASE;  Surgeon: Leanora Cover, MD;  Location: [place];  Service: Orthopedics;  Laterality: Left;   FOOT SURGERY  1997   INTRAUTERINE DEVICE INSERTION  10/07/2013   Mirena   LAPAROSCOPIC GASTRIC BANDING  04/2008   Family History:  Family History  Problem Relation Age of Onset   Diabetes Father    Hypertension Father    Osteoporosis Mother    Hypertension Mother    Family Psychiatric  History: unable to assess Tobacco Screening:   Social History:  Social History   Substance and Sexual Activity  Alcohol Use Yes   Comment: rarely     Social History   Substance and Sexual Activity  Drug Use No    Additional Social History:                           Allergies:   Allergies  Allergen Reactions   Omnicef [Cefdinir] Anaphylaxis   Penicillins Rash   Lab Results:  Results for orders placed or performed during the hospital encounter of 02/18/21 (from the past 48 hour(s))  Lipid panel     Status: Abnormal   Collection Time: 02/19/21  6:38 AM  Result Value Ref Range   Cholesterol 170 0 - 200 mg/dL   Triglycerides 90 <150 mg/dL   HDL 43 >40 mg/dL   Total CHOL/HDL Ratio 4.0 RATIO   VLDL 18 0 - 40 mg/dL   LDL Cholesterol 109 (H) 0 - 99 mg/dL    Comment:        Total Cholesterol/HDL:CHD Risk Coronary Heart Disease Risk Table                     Men   Women  1/2 Average Risk   3.4   3.3  Average Risk       5.0   4.4  2 X Average Risk   9.6   7.1  3 X Average Risk  23.4   11.0        Use the calculated Patient Ratio above and the CHD Risk Table to determine the patient's CHD Risk.        ATP III CLASSIFICATION (LDL):  <100     mg/dL   Optimal  100-129  mg/dL   Near or Above                    Optimal  130-159  mg/dL    Borderline  160-189  mg/dL   High  >190     mg/dL   Very High Performed at [facility] [address] [place], [place] [address]   Hemoglobin A1c     Status: Abnormal  Collection Time: 02/19/21  6:38 AM  Result Value Ref Range   Hgb A1c MFr Bld 4.6 (L) 4.8 - 5.6 %    Comment: (NOTE) Pre diabetes:          5.7%-6.4%  Diabetes:              >6.4%  Glycemic control for   <7.0% adults with diabetes    Mean Plasma Glucose 85.32 mg/dL    Comment: Performed at [facility] [address] [place], [place] [address]  TSH     Status: None   Collection Time: 02/19/21  6:38 AM  Result Value Ref Range   TSH 3.385 0.350 - 4.500 uIU/mL    Comment: Performed by a 3rd Generation assay with a functional sensitivity of <=0.01 uIU/mL. Performed at [facility], [place] [address] [place], [place] [id number]   Basic metabolic panel     Status: None   Collection Time: 02/19/21 12:36 PM  Result Value Ref Range   Sodium 136 135 - 145 mmol/L   Potassium 4.3 3.5 - 5.1 mmol/L   Chloride 101 98 - 111 mmol/L   CO2 27 22 - 32 mmol/L   Glucose, Bld 90 70 - 99 mg/dL    Comment: Glucose reference range applies only to samples taken after fasting for at least 8 hours.   BUN 13 6 - 20 mg/dL   Creatinine, Ser 0.94 0.44 - 1.00 mg/dL   Calcium 9.1 8.9 - 10.3 mg/dL   GFR, Estimated >60 >60 mL/min    Comment: (NOTE) Calculated using the CKD-EPI Creatinine Equation (2021)    Anion gap 8 5 - 15    Comment: Performed at [facility], [place] [address] [place], [place] [address]  CBC with Differential     Status: Abnormal   Collection Time: 02/19/21 12:36 PM  Result Value Ref Range   WBC 10.2 4.0 - 10.5 K/uL   RBC 5.29 (H) 3.87 - 5.11 MIL/uL   Hemoglobin 14.6 12.0 - 15.0 g/dL   HCT 45.5 36.0 - 46.0 %   MCV 86.0 80.0 - 100.0 fL   MCH 27.6 26.0 - 34.0 pg   MCHC 32.1 30.0 - 36.0 g/dL   RDW 12.9 11.5 - 15.5 %   Platelets 252 150 - 400 K/uL   nRBC  0.0 0.0 - 0.2 %   Neutrophils Relative % 79 %   Neutro Abs 8.0 (H) 1.7 - 7.7 K/uL   Lymphocytes Relative 16 %   Lymphs Abs 1.6 0.7 - 4.0 K/uL   Monocytes Relative 4 %   Monocytes Absolute 0.4 0.1 - 1.0 K/uL   Eosinophils Relative 1 %   Eosinophils Absolute 0.1 0.0 - 0.5 K/uL   Basophils Relative 0 %   Basophils Absolute 0.0 0.0 - 0.1 K/uL   Immature Granulocytes 0 %   Abs Immature Granulocytes 0.03 0.00 - 0.07 K/uL    Comment: Performed at [facility], [place] [address], [place], [place] [address]  Urinalysis, Routine w reflex microscopic Urine, Clean Catch     Status: Abnormal   Collection Time: 02/19/21  1:00 PM  Result Value Ref Range   Color, Urine YELLOW YELLOW   APPearance CLEAR CLEAR   Specific Gravity, Urine 1.025 1.005 - 1.030   pH 6.0 5.0 - 8.0   Glucose, UA NEGATIVE NEGATIVE mg/dL   Hgb urine dipstick LARGE (A) NEGATIVE   Bilirubin Urine NEGATIVE NEGATIVE   Ketones, ur 20 (A) NEGATIVE  mg/dL   Protein, ur 100 (A) NEGATIVE mg/dL   Nitrite NEGATIVE NEGATIVE   Leukocytes,Ua SMALL (A) NEGATIVE   RBC / HPF >50 (H) 0 - 5 RBC/hpf   WBC, UA 11-20 0 - 5 WBC/hpf   Bacteria, UA RARE (A) NONE SEEN   Squamous Epithelial / LPF 0-5 0 - 5   Mucus PRESENT    Non Squamous Epithelial 0-5 (A) NONE SEEN    Comment: Performed at [facility], [place] [address] [place], [place] [address]    Blood Alcohol level:  Lab Results  Component Value Date   ETH <10 123XX123    Metabolic Disorder Labs:  Lab Results  Component Value Date   HGBA1C 4.6 (L) 02/19/2021   MPG 85.32 02/19/2021   No results found for: PROLACTIN Lab Results  Component Value Date   CHOL 170 02/19/2021   TRIG 90 02/19/2021   HDL 43 02/19/2021   CHOLHDL 4.0 02/19/2021   VLDL 18 02/19/2021   LDLCALC 109 (H) 02/19/2021   LDLCALC 75 01/24/2011    Current Medications: Current Facility-Administered Medications  Medication Dose Route Frequency Provider Last Rate Last Admin    acetaminophen (TYLENOL) tablet 650 mg  650 mg Oral Q6H PRN Starkes-Perry, Gayland Curry, FNP       ALPRAZolam Duanne Moron) tablet 0.5 mg  0.5 mg Oral BID PRN Suella Broad, FNP       alum & mag hydroxide-simeth (MAALOX/MYLANTA) 200-200-20 MG/5ML suspension 30 mL  30 mL Oral Q4H PRN Starkes-Perry, Gayland Curry, FNP       buPROPion (WELLBUTRIN XL) 24 hr tablet 150 mg  150 mg Oral Daily Suella Broad, FNP   150 mg at 02/19/21 0818   gabapentin (NEURONTIN) capsule 300 mg  300 mg Oral QHS Suella Broad, FNP   300 mg at 02/18/21 2124   lisdexamfetamine (VYVANSE) capsule 40 mg  40 mg Oral Elonda Husky, FNP   40 mg at 02/19/21 [id number]   magnesium hydroxide (MILK OF MAGNESIA) suspension 30 mL  30 mL Oral Daily PRN Starkes-Perry, Gayland Curry, FNP       valACYclovir (VALTREX) tablet 500 mg  500 mg Oral Daily PRN Suella Broad, FNP       venlafaxine XR (EFFEXOR-XR) 24 hr capsule 75 mg  75 mg Oral Daily Suella Broad, FNP   75 mg at 02/19/21 0818   PTA Medications: Facility-Administered Medications Prior to Admission  Medication Dose Route Frequency Provider Last Rate Last Admin   levonorgestrel (MIRENA) 20 MCG/24HR IUD   Intrauterine Once Fontaine, Belinda Block, MD       Medications Prior to Admission  Medication Sig Dispense Refill Last Dose   ALPRAZolam (XANAX) 0.5 MG tablet Take 0.5 mg by mouth 2 (two) times daily as needed for anxiety or sleep.      buPROPion (WELLBUTRIN XL) 150 MG 24 hr tablet Take 150 mg by mouth daily.      gabapentin (NEURONTIN) 300 MG capsule Take 300 mg by mouth at bedtime.      lisdexamfetamine (VYVANSE) 40 MG capsule Take 40 mg by mouth every morning.      Specialty Vitamins Products (MAGNESIUM, AMINO ACID CHELATE,) 133 MG tablet Take 1 tablet by mouth at bedtime.      valACYclovir (VALTREX) 500 MG tablet Take 500 mg by mouth daily as needed (for cold sores until clear).      Venlafaxine HCl 75 MG TB24 Take 75 mg by mouth daily.  ZOVIRAX 5 % Apply 1 application topically as needed (for cold sores).       Musculoskeletal: Strength & Muscle Tone: within normal limits Gait & Station: normal Patient leans: N/A            Psychiatric Specialty Exam:  Presentation  General Appearance: Fairly Groomed; Casual; Appropriate for Environment (in noticable pain)  Eye Contact:Good  Speech:Clear and Coherent; Normal Rate  Speech Volume:Normal  Handedness:Right   Mood and Affect  Mood:Depressed  Affect:Tearful; Depressed   Thought Process  Thought Processes:Coherent  Duration of Psychotic Symptoms: No data recorded Past Diagnosis of Schizophrenia or Psychoactive disorder: No data recorded Descriptions of Associations:Intact  Orientation:Full (Time, Place and Person)  Thought Content:Logical  Hallucinations:Hallucinations: -- (Unable to Assess)  Ideas of Reference:-- (Unable to Assess)  Suicidal Thoughts:Suicidal Thoughts: -- (Present on admission but Unable to Assess)  Homicidal Thoughts:Homicidal Thoughts: -- (Unable to Assess)   Sensorium  Memory:Immediate Fair; Recent Fair  Judgment:Intact  Insight:Present   Executive Functions  Concentration:Fair  Attention Span:Fair  [place]   Psychomotor Activity  Psychomotor Activity:Psychomotor Activity: Normal   Assets  Assets:Resilience   Sleep  Sleep:Sleep: -- (Unable to Assess)    Physical Exam: Physical Exam Vitals and nursing note reviewed.  Constitutional:      General: She is in acute distress.     Appearance: Normal appearance. She is obese. She is ill-appearing. She is not toxic-appearing.  HENT:     Head: Normocephalic and atraumatic.  Pulmonary:     Effort: Pulmonary effort is normal.  Abdominal:     Tenderness: There is right CVA tenderness.  Musculoskeletal:        General: Normal range of motion.  Neurological:     General: No focal deficit present.      Mental Status: She is alert.   Review of Systems  Reason unable to perform ROS: Could not complete rest of ROS due to acuity of pain.  Genitourinary:  Positive for flank pain, hematuria and urgency.  Blood pressure (!) 138/103, pulse (!) 106, temperature 98.3 F (36.8 C), temperature source Oral, resp. rate 18, height 5\' 2"  (1.575 m), weight 95.3 kg, last menstrual period 02/08/2021, SpO2 99 %. Body mass index is 38.41 kg/m.  Treatment Plan Summary: Daily contact with patient to assess and evaluate symptoms and progress in treatment and Medication management  Shemariah Triner is a 46 yr old female who presented on 2/1 to [facility] after a Suicide Attempt via OD (12-20 tablets of 5 mg Xanax).  PPHx is unknown at this time.   Given her symptoms there is concern for obstructing kidney stone, Pyelonephritis/Cystitis, or Drug Induced Urinary Retention.  She will need to go to [facility] for workup/clearance.  Dr. Melina Copa as accepted her and she will be transferred.  H&P will need to be done tomorrow either here if she is cleared to return or by Consult Service if she needs to be admitted to [facility].  If she returns to [facility] we will place her on CIWA to ensure no withdrawal seizures happen in the setting of a [place] Overdose.  Observation Level/Precautions:  15 minute checks  Laboratory: CMP: WNL,  CBC: WNL except RBC: 5.29 and Neuto Abs: 8.0,  TSH: 3.385,  A1c: 4.6,  Lipid Panel: WNL except LDL: 109,  UA: Hgb: Large,  Ketones: 20,  Protein: 100,  Leukocytes: small,  Bacteria: Rare,  Non Squamous Epith: 0-5   Psychotherapy:    Medications:  Consultations:    Discharge Concerns:    Estimated LOS:  Other:     Physician Treatment Plan for Primary Diagnosis: MDD (major depressive disorder), recurrent episode ([place]) Long Term Goal(s): Improvement in symptoms so as ready for discharge  Short Term Goals: Ability to identify changes in lifestyle to reduce recurrence of condition will improve, Ability to verbalize  feelings will improve, Ability to disclose and discuss suicidal ideas, Ability to demonstrate self-control will improve, Ability to identify and develop effective coping behaviors will improve, and Ability to identify triggers associated with substance abuse/mental health issues will improve  Physician Treatment Plan for Secondary Diagnosis: Principal Problem:   MDD (major depressive disorder), recurrent episode ([place])  Long Term Goal(s): Improvement in symptoms so as ready for discharge  Short Term Goals: Ability to identify changes in lifestyle to reduce recurrence of condition will improve, Ability to verbalize feelings will improve, Ability to disclose and discuss suicidal ideas, Ability to demonstrate self-control will improve, Ability to identify and develop effective coping behaviors will improve, and Ability to identify triggers associated with substance abuse/mental health issues will improve  I certify that inpatient services furnished can reasonably be expected to improve the patient's condition.    Briant Cedar, MD 2/3/20234:14 PM

## 2021-02-19 NOTE — ED Notes (Signed)
Pt ambulatory to CT at this time.

## 2021-02-20 DIAGNOSIS — N390 Urinary tract infection, site not specified: Secondary | ICD-10-CM | POA: Diagnosis present

## 2021-02-20 MED ORDER — BUPROPION HCL ER (XL) 150 MG PO TB24
150.0000 mg | ORAL_TABLET | Freq: Every day | ORAL | Status: DC
  Administered 2021-02-21 – 2021-02-23 (×3): 150 mg via ORAL
  Filled 2021-02-20 (×7): qty 1

## 2021-02-20 MED ORDER — NICOTINE 14 MG/24HR TD PT24
14.0000 mg | MEDICATED_PATCH | Freq: Every day | TRANSDERMAL | Status: DC
  Administered 2021-02-21 – 2021-02-23 (×3): 14 mg via TRANSDERMAL
  Filled 2021-02-20 (×6): qty 1

## 2021-02-20 MED ORDER — GABAPENTIN 300 MG PO CAPS
300.0000 mg | ORAL_CAPSULE | Freq: Two times a day (BID) | ORAL | Status: DC
  Administered 2021-02-21 – 2021-02-23 (×6): 300 mg via ORAL
  Filled 2021-02-20 (×10): qty 1

## 2021-02-20 MED ORDER — BUPROPION HCL ER (XL) 150 MG PO TB24
150.0000 mg | ORAL_TABLET | Freq: Every day | ORAL | Status: AC
  Administered 2021-02-20: 150 mg via ORAL
  Filled 2021-02-20 (×2): qty 1

## 2021-02-20 MED ORDER — GABAPENTIN 300 MG PO CAPS
600.0000 mg | ORAL_CAPSULE | Freq: Every day | ORAL | Status: DC
  Administered 2021-02-20 – 2021-02-22 (×3): 600 mg via ORAL
  Filled 2021-02-20 (×4): qty 2
  Filled 2021-02-20: qty 1
  Filled 2021-02-20: qty 2

## 2021-02-20 MED ORDER — PHENAZOPYRIDINE HCL 200 MG PO TABS
200.0000 mg | ORAL_TABLET | Freq: Three times a day (TID) | ORAL | Status: AC
  Administered 2021-02-20 – 2021-02-21 (×3): 200 mg via ORAL
  Filled 2021-02-20 (×6): qty 1

## 2021-02-20 MED ORDER — NITROFURANTOIN MONOHYD MACRO 100 MG PO CAPS
100.0000 mg | ORAL_CAPSULE | Freq: Two times a day (BID) | ORAL | Status: DC
Start: 2021-02-20 — End: 2021-02-23
  Administered 2021-02-20 – 2021-02-23 (×7): 100 mg via ORAL
  Filled 2021-02-20 (×11): qty 1

## 2021-02-20 MED ORDER — BUPROPION HCL ER (XL) 300 MG PO TB24
300.0000 mg | ORAL_TABLET | Freq: Every day | ORAL | Status: DC
  Filled 2021-02-20 (×2): qty 1

## 2021-02-20 NOTE — [facility] Group Notes (Signed)
Psychoeducational Group Note ° ° ° °Date:02/20/21 °Time: 1300-1400 ° ° ° °Purpose of Group: . The group focus' on teaching patients on how to identify their needs and their Life Skills:  A group where two lists are made. What people need and what are things that we do that are unhealthy. The lists are developed by the patients and it is explained that we often do the actions that are not healthy to get our list of needs met. ° °Goal:: to develop the coping skills needed to get their needs met ° °Participation Level:  Active ° °Participation Quality:  Appropriate ° °Affect:  Appropriate ° °Cognitive:  Oriented ° °Insight:  Improving ° °Engagement in Group:  Engaged ° °Additional Comments:  ... ° °Belton Peplinski A ° °

## 2021-02-20 NOTE — Group Note (Signed)
LCSW Group Therapy Note ° °No therapy group could be held today due to other needs on the unit that were prioritized.  The patient did not go without group, as another licensed group was held by an RN. ° °Daisy Wedemeyer Grossman-Orr, LCSW °11/07/2020 °10:03 AM  °   °

## 2021-02-20 NOTE — Progress Notes (Signed)
D. Pt presented with an sad affect- brightened upon approach- reported improving urinary symptoms this am, but complained of somewhat poor sleep due to restless legs. Pt has been visible in the milieu interacting well with peers and staff, and observed attending groups. Pt currently denies SI/HI and AVH   A. Labs and vitals monitored. Pt given and educated on medications. Pt supported emotionally and encouraged to express concerns and ask questions.   R. Pt remains safe with 15 minute checks. Will continue POC.
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
## 2021-02-20 NOTE — [facility] Group Notes (Signed)
Adult Psychoeducational Group Note  Date:  02/20/2021 Time:  8:52 PM  Group Topic/Focus:  Goals Group:   The focus of this group is to help patients establish daily goals to achieve during treatment and discuss how the patient can incorporate goal setting into their daily lives to aide in recovery.  Participation Level:  Active  Participation Quality:  Attentive  Affect:  Appropriate  Cognitive:  Alert  Insight: Good  Engagement in Group:  Engaged  Modes of Intervention:  Discussion  Additional Comments  Jacalyn Lefevre 02/20/2021, 8:52 PM

## 2021-02-20 NOTE — H&P (Addendum)
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 associated with substance abuse/mental health issues will improve  I certify that inpatient services furnished can reasonably be expected to improve the patient's condition.     PGY-2 Eliseo Gum, MD 2/4/20236:36 PM
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
## 2021-02-20 NOTE — Progress Notes (Signed)
Patient reports having had a good day and reports that her symptoms are minimal. She has been up in the dayroom interacting with peers and attended group. Support given and safety maintained with 15 min checks.

## 2021-02-20 NOTE — Progress Notes (Signed)
Patient has been up and active on the unit. She reported being sent over to the ED for pxs with urinating and right flank pain. Patient reports she has scripts for antibiotic and prydium medication to be taken. Support giver and safety maintained with 15 min checks,

## 2021-02-20 NOTE — [facility] Group Notes (Signed)
Goals Group °02/20/2021 ° ° °Group Focus: affirmation, clarity of thought, and goals/reality orientation °Treatment Modality:  Psychoeducation °Interventions utilized were assignment, group exercise, and support °Purpose: To be able to understand and verbalize the reason for their admission to the hospital. To understand that the medication helps with their chemical imbalance but they also need to work on their choices in life. To be challenged to develop a list of 30 positives about themselves. Also introduce the concept that "feelings" are not reality. ° °Participation Level:  Active ° °Participation Quality:  Appropriate ° °Affect:  Appropriate ° °Cognitive:  Appropriate ° °Insight:  Improving ° °Engagement in Group:  Engaged ° °Additional Comments:  ... ° °Daisy Hoffman A °

## 2021-02-20 NOTE — [facility] Group Notes (Signed)
Psychoeducational Group Note ° ° ° °Date:02/20/21 °Time: 1300-1400 ° ° ° °Purpose of Group: . The group focus' on teaching patients on how to identify their needs and their Life Skills:  A group where two lists are made. What people need and what are things that we do that are unhealthy. The lists are developed by the patients and it is explained that we often do the actions that are not healthy to get our list of needs met. ° °Goal:: to develop the coping skills needed to get their needs met ° °Participation Level:  Active ° °Participation Quality:  Appropriate ° °Affect:  Appropriate ° °Cognitive:  Oriented ° °Insight:  Improving ° °Engagement in Group:  Engaged ° °Additional Comments:  ... ° °Shivonne Schwartzman A ° °

## 2021-02-21 LAB — URINE CULTURE: Culture: >=100000 — AB

## 2021-02-21 MED ORDER — WHITE PETROLATUM EX OINT
TOPICAL_OINTMENT | CUTANEOUS | Status: AC
  Filled 2021-02-21: qty 5

## 2021-02-21 MED ORDER — DOCUSATE SODIUM 100 MG PO CAPS
100.0000 mg | ORAL_CAPSULE | Freq: Every day | ORAL | Status: DC
  Administered 2021-02-21 – 2021-02-23 (×3): 100 mg via ORAL
  Filled 2021-02-21 (×6): qty 1

## 2021-02-21 NOTE — [facility] Group Notes (Signed)
Adult Psychoeducational Group Not Date:  02/21/2021 Time:  [id number] Group Topic/Focus: PROGRESSIVE RELAXATION. A group where deep breathing is taught and tensing and relaxation muscle groups is used. Imagery is used as well.  Pts are asked to imagine 3 pillars that hold them up when they are not able to hold themselves up.  Participation Level:  Active  Participation Quality:  Appropriate  Affect:  Appropriate  Cognitive:  Oriented  Insight: Improving  Engagement in Group:  Engaged  Modes of Intervention:  Activity, Discussion, Education, and Support  Additional Comments:  Rates her energy level at an 8/10. States her daughter, her boyfriend, her pets and God  Daisy Hoffman

## 2021-02-21 NOTE — Progress Notes (Signed)
[facility] MD Progress Note  02/21/2021 7:17 AM Daisy Hoffman  MRN:  [id number]  Chief Complaint: suicide attempt  Reason for Admission: Daisy Hoffman is a 46 y.o. female with a history of anxiety and depression, who was initially admitted for inpatient psychiatric hospitalization on 02/18/2021 for management of worsening depression resulting in intentional overdose on Xanax prior to admission in the context of psychosocial stressors. The patient is currently on Hospital Day 3.   Chart Review from last 24 hours:  The patient's chart was reviewed and nursing notes were reviewed. The patient's case was discussed in multidisciplinary team meeting. Per [facility]   Information Obtained Today During Patient Interview: The patient was seen and evaluated on the unit. On assessment today the patient reports that she is doing well today.  She has spent time talking to her boyfriend on the phone, and he is helping her make plans to see if she can sell her house in a short sale with the bank to protect her credit.  He is also helping her look at places she can rent until she can financially get reestablished.  She states that she is somewhat emotional when she realized that her parents have been given her code to contact her in the hospital but they have not reached out yet.  Supportive therapy was provided and she states that she is trying to focus on things that are within her control.  She is forward thinking on exam today and states she feels more hopeful.  She states that she has had good conversations with her daughter.  She denies medication side effects and reports improved sleep and adequate appetite.  She denies AVH, SI, HI, paranoia or ideas of reference.  She states she was a little nauseated with use of her antibiotic today but this resolved after eating.  Other than some residual urinary frequency she states her UTI symptoms are improving.  She also has had some mild constipation and we discussed starting a stool  softener.  She voices no other physical complaints.  I again discussed plans to gradually retitrate onto higher Wellbutrin dose given her recent benzodiazepine overdose and potential seizure risk given the nature of her recent ingestion.  She verbalized understanding.  Principal Problem: MDD (major depressive disorder), recurrent episode (HCC) Diagnosis: Principal Problem:   MDD (major depressive disorder), recurrent episode (HCC) Active Problems:   UTI (urinary tract infection)  Total Time Spent in Direct Patient Care:  I personally spent 30 minutes on the unit in direct patient care. The direct patient care time included face-to-face time with the patient, reviewing the patient's chart, communicating with other professionals, and coordinating care. Greater than 50% of this time was spent in counseling or coordinating care with the patient regarding goals of hospitalization, psycho-education, and discharge planning needs.  Past Psychiatric History: see H&P  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, 11/2006, 02/2007, 08/2008.,02/2008   C&B LGSIL 11/2005, C&B 05/2006 PAP LGSIL A FEW HIGHER,   Restless leg syndrome    Snores     Past Surgical History:  Procedure Laterality Date   CARPAL TUNNEL RELEASE Right 04/28/2016   Procedure: RIGHT CARPAL TUNNEL RELEASE;  Surgeon: Betha Loa, MD;  Location: [facility];  Service: Orthopedics;  Laterality: Right;   CARPAL TUNNEL RELEASE Left 06/16/2016   Procedure: LEFT CARPAL TUNNEL RELEASE;  Surgeon: Betha Loa, MD;  Location: [facility];  Service: Orthopedics;  Laterality: Left;   FOOT SURGERY  1997   INTRAUTERINE DEVICE INSERTION  10/07/2013   Mirena   LAPAROSCOPIC GASTRIC BANDING  04/2008   Family  History:  Family History  Problem Relation Age of Onset   Diabetes Father    Hypertension Father    Osteoporosis Mother    Hypertension Mother    Family Psychiatric  History: see H&P  Social History:  Social History   Substance and Sexual Activity  Alcohol Use Yes   Comment: rarely     Social History   Substance and Sexual Activity  Drug Use No    Social History   Socioeconomic History   Marital status: Divorced    Spouse name: Not on file   Number of children: 1   Years of education: Not on file   Highest education level: Associate degree: academic program  Occupational History   Not on file  Tobacco Use   Smoking status: Former    Packs/day: 1.00    Years: 22.00    Pack years: 22.00    Types: Cigarettes    Start date: 01/25/2012    Quit date: 07/27/2012    Years since quitting: 8.5   Smokeless tobacco: Never   Tobacco comments:    vapes with nicotene daily  Vaping Use   Vaping Use: Every day   Substances: Nicotine, CBD, Flavoring  Substance and Sexual Activity   Alcohol use: Yes    Comment: rarely   Drug use: No   Sexual activity: Not on file    Comment: Mirena 10/07/2013  Other Topics Concern   Not on file  Social History Narrative   Lives with daughter   Caffeine- coffee 5 cups daily   Social Determinants of Health   Financial Resource Strain: Not on file  Food Insecurity: Not on file  Transportation Needs: Not on file  Physical Activity: Not on file  Stress: Not on file  Social Connections: Not on file   Sleep: Good  Appetite:  Good  Current Medications: Current Facility-Administered Medications  Medication Dose Route Frequency Provider Last Rate Last Admin   acetaminophen (TYLENOL) tablet 650 mg  650 mg Oral Q6H PRN Starkes-Perry, Juel Burrow, FNP       alum & mag hydroxide-simeth (MAALOX/MYLANTA) 200-200-20 MG/5ML suspension 30 mL  30 mL Oral Q4H PRN Starkes-Perry, Juel Burrow, FNP       buPROPion (WELLBUTRIN XL) 24 hr tablet 150 mg  150 mg  Oral Daily Mason Jim, Buddy Loeffelholz E, MD       gabapentin (NEURONTIN) capsule 300 mg  300 mg Oral BID Mason Jim, Thurlow Gallaga E, MD       gabapentin (NEURONTIN) capsule 600 mg  600 mg Oral QHS Mason Jim, Jarmar Rousseau E, MD   600 mg at 02/20/21 2115   hydrOXYzine (ATARAX) tablet 25 mg  25 mg Oral Q6H PRN Massengill, Harrold Donath, MD       loperamide (IMODIUM) capsule 2-4 mg  2-4 mg Oral PRN Massengill, Nathan, MD       LORazepam (ATIVAN) tablet 1 mg  1 mg Oral Q6H PRN Massengill, Nathan, MD       magnesium hydroxide (MILK OF MAGNESIA) suspension 30 mL  30 mL Oral Daily PRN Starkes-Perry, Juel Burrow, FNP       nicotine (NICODERM CQ - dosed in mg/24  hours) patch 14 mg  14 mg Transdermal Daily Eliseo GumMcQuilla, Jai B, MD       nitrofurantoin (macrocrystal-monohydrate) (MACROBID) capsule 100 mg  100 mg Oral Q12H Massengill, Harrold DonathNathan, MD   100 mg at 02/20/21 2116   ondansetron (ZOFRAN-ODT) disintegrating tablet 4 mg  4 mg Oral Q6H PRN Massengill, Harrold DonathNathan, MD       phenazopyridine (PYRIDIUM) tablet 200 mg  200 mg Oral TID WC Massengill, Harrold DonathNathan, MD   200 mg at 02/21/21 16100627   valACYclovir (VALTREX) tablet 500 mg  500 mg Oral Daily PRN Maryagnes AmosStarkes-Perry, Takia S, FNP       venlafaxine XR (EFFEXOR-XR) 24 hr capsule 75 mg  75 mg Oral Daily Maryagnes AmosStarkes-Perry, Takia S, FNP   75 mg at 02/20/21 [id number]    Lab Results:  Results for orders placed or performed during the hospital encounter of 02/18/21 (from the past 48 hour(s))  Basic metabolic panel     Status: None   Collection Time: 02/19/21 12:36 PM  Result Value Ref Range   Sodium 136 135 - 145 mmol/L   Potassium 4.3 3.5 - 5.1 mmol/L   Chloride 101 98 - 111 mmol/L   CO2 27 22 - 32 mmol/L   Glucose, Bld 90 70 - 99 mg/dL    Comment: Glucose reference range applies only to samples taken after fasting for at least 8 hours.   BUN 13 6 - 20 mg/dL   Creatinine, Ser 5.400.94 0.44 - 1.00 mg/dL   Calcium 9.1 8.9 - 98.110.3 mg/dL   GFR, Estimated >19>60 >14>60 mL/min    Comment: (NOTE) Calculated using the CKD-EPI Creatinine  Equation (2021)    Anion gap 8 5 - 15    Comment: Performed at [facility], [address] [place], [place] [address]  CBC with Differential     Status: Abnormal   Collection Time: 02/19/21 12:36 PM  Result Value Ref Range   WBC 10.2 4.0 - 10.5 K/uL   RBC 5.29 (H) 3.87 - 5.11 MIL/uL   Hemoglobin 14.6 12.0 - 15.0 g/dL   HCT 62.145.5 30.836.0 - 65.746.0 %   MCV 86.0 80.0 - 100.0 fL   MCH 27.6 26.0 - 34.0 pg   MCHC 32.1 30.0 - 36.0 g/dL   RDW 84.612.9 96.211.5 - 95.215.5 %   Platelets 252 150 - 400 K/uL   nRBC 0.0 0.0 - 0.2 %   Neutrophils Relative % 79 %   Neutro Abs 8.0 (H) 1.7 - 7.7 K/uL   Lymphocytes Relative 16 %   Lymphs Abs 1.6 0.7 - 4.0 K/uL   Monocytes Relative 4 %   Monocytes Absolute 0.4 0.1 - 1.0 K/uL   Eosinophils Relative 1 %   Eosinophils Absolute 0.1 0.0 - 0.5 K/uL   Basophils Relative 0 %   Basophils Absolute 0.0 0.0 - 0.1 K/uL   Immature Granulocytes 0 %   Abs Immature Granulocytes 0.03 0.00 - 0.07 K/uL    Comment: Performed at [facility], [address] [place], [place] [address]  Urinalysis, Routine w reflex microscopic Urine, Clean Catch     Status: Abnormal   Collection Time: 02/19/21  1:00 PM  Result Value Ref Range   Color, Urine YELLOW YELLOW   APPearance CLEAR CLEAR   Specific Gravity, Urine 1.025 1.005 - 1.030   pH 6.0 5.0 - 8.0   Glucose, UA NEGATIVE NEGATIVE mg/dL   Hgb urine dipstick LARGE (A) NEGATIVE   Bilirubin Urine NEGATIVE NEGATIVE   Ketones, ur  20 (A) NEGATIVE mg/dL   Protein, ur 324 (A) NEGATIVE mg/dL   Nitrite NEGATIVE NEGATIVE   Leukocytes,Ua SMALL (A) NEGATIVE   RBC / HPF >50 (H) 0 - 5 RBC/hpf   WBC, UA 11-20 0 - 5 WBC/hpf   Bacteria, UA RARE (A) NONE SEEN   Squamous Epithelial / LPF 0-5 0 - 5   Mucus PRESENT    Non Squamous Epithelial 0-5 (A) NONE SEEN    Comment: Performed at [facility], [address], [place], [place] [address]  Urine Culture     Status: Abnormal   Collection Time:  02/19/21  1:13 PM   Specimen: Urine, Clean Catch  Result Value Ref Range   Specimen Description      URINE, CLEAN CATCH Performed at [facility], [address] [place] [address] [place], [place] [address]    Special Requests      NONE Performed at [facility], [address] [place], [place] [address]    Culture >=100,000 COLONIES/mL ESCHERICHIA COLI (A)    Report Status 02/21/2021 FINAL    Organism ID, Bacteria ESCHERICHIA COLI (A)       Susceptibility   Escherichia coli - MIC*    AMPICILLIN >=32 RESISTANT Resistant     CEFAZOLIN <=4 SENSITIVE Sensitive     CEFEPIME <=0.12 SENSITIVE Sensitive     CEFTRIAXONE <=0.25 SENSITIVE Sensitive     CIPROFLOXACIN <=0.25 SENSITIVE Sensitive     GENTAMICIN >=16 RESISTANT Resistant     IMIPENEM <=0.25 SENSITIVE Sensitive     NITROFURANTOIN <=16 SENSITIVE Sensitive     TRIMETH/SULFA >=320 RESISTANT Resistant     AMPICILLIN/SULBACTAM >=32 RESISTANT Resistant     PIP/TAZO <=4 SENSITIVE Sensitive     * >=100,000 COLONIES/mL ESCHERICHIA COLI    Blood Alcohol level:  Lab Results  Component Value Date   ETH <10 02/17/2021    Metabolic Disorder Labs: Lab Results  Component Value Date   HGBA1C 4.6 (L) 02/19/2021   MPG 85.32 02/19/2021   No results found for: PROLACTIN Lab Results  Component Value Date   CHOL 170 02/19/2021   TRIG 90 02/19/2021   HDL 43 02/19/2021   CHOLHDL 4.0 02/19/2021   VLDL 18 02/19/2021   LDLCALC 109 (H) 02/19/2021   LDLCALC 75 01/24/2011    Physical Findings: CIWA:  CIWA-Ar Total: 0  Musculoskeletal: Strength & Muscle Tone: within normal limits Gait & Station: normal Patient leans: N/A  Psychiatric Specialty Exam:  Presentation  General Appearance: Appropriate for Environment; Casual  Eye Contact:Good  Speech:Clear and Coherent  Speech Volume:Normal  Handedness:Right   Mood and Affect  Mood:described as improving - appears more euthymic  Affect:overall brighter  but tearful when discussing parents; calm, polite   Thought Process  Thought Processes:linear, goal directed  Descriptions of Associations:Intact  Orientation:Full (Time, Place and Person)  Thought Content:Logical - denies AVH, paranoia, ideas of reference or delusions  Hallucinations:Hallucinations: None  Ideas of Reference:None  Suicidal Thoughts:Suicidal Thoughts: No  Homicidal Thoughts:Homicidal Thoughts: No   Sensorium  Memory:Immediate Good; Recent Good; Remote Good  Judgment:-- (Improving)  Insight:-- (Improving)   Executive Functions  Concentration:Good  Attention Span:Good  Recall:Good  Fund of Knowledge:Good  Language:Good   Psychomotor Activity  Psychomotor Activity:Psychomotor Activity: Psychomotor Retardation   Assets  Assets:Resilience; Social Support; Desire for Improvement   Sleep  Total time not recorded  Physical Exam Vitals reviewed.  Constitutional:      Appearance: Normal appearance.  HENT:     Head:  Normocephalic.  Pulmonary:     Effort: Pulmonary effort is normal.  Neurological:     General: No focal deficit present.     Mental Status: She is alert.   Review of Systems  Respiratory:  Negative for shortness of breath.   Cardiovascular:  Negative for chest pain and palpitations.  Gastrointestinal:  Positive for constipation and nausea. Negative for diarrhea and vomiting.  Neurological:  Negative for dizziness and headaches.  Blood pressure 114/78, pulse 83, temperature 97.8 F (36.6 C), temperature source Oral, resp. rate 18, height 5\' 2"  (1.575 m), weight 95.3 kg, last menstrual period 02/08/2021, SpO2 100 %. Body mass index is 38.41 kg/m.   Treatment Plan Summary: Diagnoses / Active Problems: MDD recurrent severe without psychotic features Binge eating d/o by hx UTI Nicotine use d/o Constipation  PLAN: Safety and Monitoring:  -- Involuntary admission to inpatient psychiatric unit for safety, stabilization and  treatment  -- Daily contact with patient to assess and evaluate symptoms and progress in treatment  -- Patient's case to be discussed in multi-disciplinary team meeting  -- Observation Level : q15 minute checks  -- Vital signs:  q12 hours  -- Precautions: suicide, elopement, and assault  2. Psychiatric Diagnoses and Treatment:   MDD recurrent severe without psychotic features Binge eating d/o by hx -- Patient to continue Wellbutrin XL 150mg  today for depression titrating up slowly to avoid potential seizure risk given recent benzodiazepine overdose -- Continue Effexor XR 75mg  daily for depression -- Continue Neurontin 300mg  bid and 600mg  qhs for anxiety, sleep and to mitigate risk of seizures after recent benzodiazepine overdose  -- Holding Vyvanse for binge eating - advised to speak to outpatient provider about this med after discharge  -- Continue CIWA monitoring for potential benzodiazepine withdrawal after recent overdose (recent scores 0,0)  -- Encouraged patient to participate in unit milieu and in scheduled group therapies   -- Short Term Goals: Ability to verbalize feelings will improve, Ability to disclose and discuss suicidal ideas, Ability to demonstrate self-control will improve, Ability to identify and develop effective coping behaviors will improve, and Ability to maintain clinical measurements within normal limits will improve  -- Long Term Goals: Improvement in symptoms so as ready for discharge   3. Medical Issues Being Addressed:   Tobacco Use Disorder  -- Nicotine patch 14mg /24 hours ordered  -- Smoking cessation encouraged   UTI  -- Urine culture shows >100,000CFU E coli  -- Continue Macrobid 100mg  bid for 7 day course and pyridium 200mg  tid for 2 days (sensitive to Macrobid per c/s report)   Constipation  - Start colace 100mg  daily and encouraged MOM and increased fluids/fiber   Abnormal EKG   -- Repeat EKG shows Q waves in III and avF but not anterolateral  changes as seen on admission EKG- patient asymptomatic and advised to see PCP for f/u after discharge  4. Discharge Planning:   -- Social work and case management to assist with discharge planning and identification of hospital follow-up needs prior to discharge  -- Estimated LOS: 5-7 days  -- Discharge Concerns: Need to establish a safety plan; Medication compliance and effectiveness  -- Discharge Goals: Return home with outpatient referrals for mental health follow-up including medication management/psychotherapy  Comer LocketAmy E Grae Cannata, MD, FAPA 02/21/2021, 7:17 AM

## 2021-02-21 NOTE — Progress Notes (Signed)
Patient has been up and active on the unit, attended group this evening and has voiced no complaints. Patient reports she had a talk with her father that went well. Patient currently denies having pain, -si/hi/a/v hall. Support and encouragement offered, safety maintained on unit, will continue to monitor.   02/21/21 1300  Psych Admission Type (Psych Patients Only)  Admission Status Involuntary  Psychosocial Assessment  Patient Complaints None  Eye Contact Fair  Facial Expression Worried  Affect Depressed  Speech Logical/coherent  Interaction Assertive  Motor Activity Slow  Appearance/Hygiene Unremarkable  Behavior Characteristics Cooperative  Mood Pleasant  Thought Process  Coherency WDL  Content WDL  Delusions None reported or observed  Perception WDL  Hallucination None reported or observed  Judgment WDL  Confusion None  Danger to Self  Current suicidal ideation? Denies  Danger to Others  Danger to Others None reported or observed

## 2021-02-21 NOTE — Group Note (Signed)
[facility] LCSW Group Therapy Note  02/21/2021    Type of Therapy and Topic:  Group Therapy:   Processing Significant Event and Introduction to Supporting Self  Participation Level:  Active   Description of Group:  Patients in this group were first given the opportunity to process an event that took place in the prior group during which a fellow group member had an epileptic seizure and other patients had to immediately leave the room, do not know what happened, and will not get news of the person's recovery.  CSW compared that event to having a psychiatric crisis and asked how patients and their loved ones would react the same or differently.  There was a healthy discussion about this.  We then talked about needs for support after discharge, including the fact that we have to (1) know our own diagnosis, (2) be able to describe the symptoms of their illness, (3) be willing to figure out a way to share this with important people in our lives.  A song entitled "My Own Hero" was played which patients said inspired them to realize they must help themselves first and foremost.  WWe also talked about how to put up necessary boundaries to limit unhealthy supports from harming [place].  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  Her reaction to the significant event that took place was a little tearful because her daughter has a seizure disorder and she has helped her daughter through many  seizures.  She felt helpless during the event.  Her thoughts about self-support were very positive.  The quality of patient's participation was excellent.  Therapeutic Modalities:   Motivational Interviewing Processing  Lynnell Chad

## 2021-02-21 NOTE — Progress Notes (Signed)
D. Pt has been calm and cooperative,  friendly during interactions. Pt has been visible in the milieu interacting well with peers,and observed attending groups. Pt has reported much improvement in urinary symptoms, but has not had a BM since Thursday. Per pt's self inventory, pt rated her depression, hopelessness and anxiety all 1's today.Pt wrote that her goal was to work on "attending groups and writing in journal.". Pt currently denies SI/HI and AVH  A. Labs and vitals monitored. Pt given scheduled meds and MOM for constipation. Pt supported emotionally and encouraged to express concerns and ask questions.   R. Pt remains safe with 15 minute checks. Will continue POC.

## 2021-02-21 NOTE — [facility] Group Notes (Signed)
Psychoeducational Group Note  Date:  02/21/2021 Time:  1300-1400   Group Topic/Focus: This is a continuation of the group from Saturday. Pt's have been asked to formulate a list of 30 positives about themselves. This list is to be read 2 times a day for 30 days, looking in a mirror. Changing patterns of negative self talk. Also discussed is the fact that there have been some people who hurt [place] in the past. We keep that memory alive within [place]. Ways to cope with this are discused   Participation Level:  Active  Participation Quality:  Appropriate  Affect:  Appropriate  Cognitive:  Oriented  Insight: Improving  Engagement in Group:  Engaged  Modes of Intervention:  Activity, Discussion, Education, and Support  Additional Comments:  Pt rates her energy at a 7/10. Participated fully in the group  Daisy Hoffman A

## 2021-02-21 NOTE — Progress Notes (Signed)
Pt remains A&OX4, pleasant, and cooperative. Pt denies suicidal and homicidal ideations. Pt denies auditory and visual hallucinations. No current complaints and/or concerns.

## 2021-02-21 NOTE — [facility] Suicide Risk Assessment (Signed)
[place] INPATIENT:  Family/Significant Other Suicide Prevention Education  Suicide Prevention Education:  Contact Attempts:  w friend ("boyfriend") April Holding [phone], has been identified by the patient as the family member/significant other with whom the patient will be residing, and identified as the person(s) who will aid the patient in the event of a mental health crisis.  With written consent from the patient, two attempts were made to provide suicide prevention education, prior to and/or following the patient's discharge.  We were unsuccessful in providing suicide prevention education.  A suicide education pamphlet was given to the patient to share with family/significant other.  CSW attempted to call friend April Holding to complete SPE. CSW had no contact with friend and left a HIPPA compliant voicemail.   Date and time of first attempt:02/21/2021/2:00pm   Read Drivers, MSW, LCSW-A 02/21/2021, 3:13 PM

## 2021-02-22 DIAGNOSIS — F332 Major depressive disorder, recurrent severe without psychotic features: Secondary | ICD-10-CM

## 2021-02-22 MED ORDER — PNEUMOCOCCAL VAC POLYVALENT 25 MCG/0.5ML IJ INJ
0.5000 mL | INJECTION | INTRAMUSCULAR | Status: DC
  Filled 2021-02-22: qty 0.5

## 2021-02-22 MED ORDER — INFLUENZA VAC SPLIT QUAD 0.5 ML IM SUSY
0.5000 mL | PREFILLED_SYRINGE | INTRAMUSCULAR | Status: DC
Start: 2021-02-23 — End: 2021-02-23
  Filled 2021-02-22: qty 0.5

## 2021-02-22 NOTE — [facility] Counselor (Signed)
CSW did a CPS report for concerns about daughter finding mother after suicide attempt. CPS intake coordinator agreed to call this CSW back if case accepted with CPS worker contact info.    Mivaan Corbitt, LCSW, LCAS Clincal Social Worker  [facility]

## 2021-02-22 NOTE — Progress Notes (Signed)
Pt denies SI/HI/AVH and verbally agrees to approach staff if these become apparent or before harming themselves/others. Rates depression 0/10. Rates anxiety 3/10. Rates pain 0/10.  Pt has been bright and interacting well with others. Scheduled medications administered to pt, per MD orders. RN provided support and encouragement to pt. Q15 min safety checks implemented and continued. Pt safe on the unit. RN will continue to monitor and intervene as needed.   02/22/21 7096  Psych Admission Type (Psych Patients Only)  Admission Status Involuntary  Psychosocial Assessment  Patient Complaints Anxiety;Sadness;Crying spells  Eye Contact Fair  Facial Expression Anxious;Worried;Sad  Affect Depressed;Sad  Speech Logical/coherent  Interaction Assertive  Motor Activity Other (Comment) (WDL)  Appearance/Hygiene Unremarkable  Behavior Characteristics Anxious;Appropriate to situation;Cooperative  Mood Anxious;Pleasant;Sad  Thought Process  Coherency WDL  Content Blaming self  Delusions None reported or observed  Perception WDL  Hallucination None reported or observed  Judgment WDL  Confusion None  Danger to Self  Current suicidal ideation? Denies  Danger to Others  Danger to Others None reported or observed

## 2021-02-22 NOTE — Progress Notes (Addendum)
[facility] MD Progress Note  02/22/2021 4:36 PM Daisy Hoffman  MRN:  [id number] Subjective:   Daisy Hoffman is a 46 yr old female who presented on 2/1 to [facility] after a Suicide Attempt via OD (12-20 tablets of 5 mg Xanax) she was admitted to [facility] on 2/3.  PPHx is significant for Depression and Anxiety.   Case was discussed in the multidisciplinary team. MAR was reviewed and patient was compliant with medications.  She did receive PRN Milk of Magnesia due to constipation.   Psychiatric Team made the following recommendations yesterday: -Continue Wellbutrin XL 150 mg daily (titrating up slowly to avoid potential seizure risk given recent benzodiazepine overdose) -Continue Effexor XR 75 mg daily  -Continue Gabapentin 300 mg BID and 600 mg QHS for anxiety, sleep and to mitigate risk of seizures after recent benzodiazepine overdose -Hold Vyvanse for binge eating (will speak to outpatient provider about this med after discharge) -Continue CIWA monitoring -Continue Macrobid 100 mg BID for 7 days (Day 3 of 7)    On interview today patient reports that she slept well last night.  She states her appetite is doing good.  She reports no SI, HI, or AVH.    She reports no issues with her medications.  She reports that her UTI symptoms have resolved and she is doing much better now that she is on antibiotics.  She states she is urinating more frequently but that is because she is making a conscious effort to drink more.  Discussed with her that she will complete a 7-day course but that this could cause diarrhea.  Discussed with her that if she had significant or diarrhea for several days she could be at risk for C. difficile and that she should be evaluated immediately.  Asked about her constipation.  She states that she was able to have a BM last night.  Discussed that she can continue taking the Colace and has the Milk of Magnesia if needed.  Also encouraged her to continue with her increased water uptake as  this will help prevent further constipation.  Discussed with her that since she overdosed on a benzodiazepine we need to be careful with increases in her Wellbutrin as Wellbutrin lowers the seizure threshold.  Discussed that increasing the Wellbutrin too fast could put her at risk of a seizure and she reported understanding.  Discussed with her how she would like to proceed.  She states that she feels like her mood has improved and she is in a more stable place with the current dose of Wellbutrin and so would like to keep it where it is for now and will follow up with her outpatient provider to determine if further increases need to be made.  She would like to be discharged tomorrow because her boyfriend is starting a new job in [place] and she would like to go with him.  Discussed that we would consider it especially if we were not going to make any further medication changes at this time.  She reports no other issues are present.   Update 4 PM: Discussed with patient that our social worker had contacted CPS to file a report since her 27 year old daughter was the one to find her after her suicide attempt.  She reported understanding of this.  Discussed with her that while she would be cleared for discharge from our standpoint we would need to wait for clearance from CPS.  She stated that she had made arrangements with her parents that if needed  her daughter could stay with them.  Discussed that CPS would make recommendations and we could follow from there.  She reported understanding and had no other concerns at present.   Principal Problem: MDD (major depressive disorder), recurrent episode (HCC) Diagnosis: Principal Problem:   MDD (major depressive disorder), recurrent episode (HCC) Active Problems:   UTI (urinary tract infection)  Total Time spent with patient:  I personally spent 30 minutes on the unit in direct patient care. The direct patient care time included face-to-face time with the  patient, reviewing the patient's chart, communicating with other professionals, and coordinating care. Greater than 50% of this time was spent in counseling or coordinating care with the patient regarding goals of hospitalization, psycho-education, and discharge planning needs.   Past Psychiatric History: Depression and Anxiety.  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, 11/2006, 02/2007, 08/2008.,02/2008   C&B LGSIL 11/2005, C&B 05/2006 PAP LGSIL A FEW HIGHER,   Restless leg syndrome    Snores     Past Surgical History:  Procedure Laterality Date   CARPAL TUNNEL RELEASE Right 04/28/2016   Procedure: RIGHT CARPAL TUNNEL RELEASE;  Surgeon: Betha Loa, MD;  Location: [facility];  Service: Orthopedics;  Laterality: Right;   CARPAL TUNNEL RELEASE Left 06/16/2016   Procedure: LEFT CARPAL TUNNEL RELEASE;  Surgeon: Betha Loa, MD;  Location: [facility];  Service: Orthopedics;  Laterality: Left;   FOOT SURGERY  1997   INTRAUTERINE DEVICE INSERTION  10/07/2013   Mirena   LAPAROSCOPIC GASTRIC BANDING  04/2008   Family History:  Family History  Problem Relation Age of Onset   Diabetes Father    Hypertension Father    Osteoporosis Mother    Hypertension Mother    Family Psychiatric  History: Maternal Grandmother- OD on opioids, concern for Bipolar Disorder and Schizophrenia. Daughter- Multiple dx from Neuro and Psych, history of TBI Social History:  Social History   Substance and Sexual Activity  Alcohol Use Yes   Comment: rarely     Social History   Substance and Sexual Activity  Drug Use No    Social History   Socioeconomic History   Marital status: Divorced    Spouse name: Not on file   Number of children: 1   Years of education:  Not on file   Highest education level: Associate degree: academic program  Occupational History   Not on file  Tobacco Use   Smoking status: Former    Packs/day: 1.00    Years: 22.00    Pack years: 22.00    Types: Cigarettes    Start date: 01/25/2012    Quit date: 07/27/2012    Years since quitting: 8.5   Smokeless tobacco: Never   Tobacco comments:    vapes with nicotene daily  Vaping Use   Vaping Use: Every day   Substances: Nicotine, CBD, Flavoring  Substance and Sexual Activity   Alcohol use: Yes    Comment: rarely   Drug use: No   Sexual activity: Not on file    Comment: Mirena 10/07/2013  Other Topics Concern   Not on file  Social History Narrative   Lives with daughter   Caffeine- coffee 5 cups daily   Social Determinants  of Health   Financial Resource Strain: Not on file  Food Insecurity: Not on file  Transportation Needs: Not on file  Physical Activity: Not on file  Stress: Not on file  Social Connections: Not on file   Additional Social History:                         Sleep: Good  Appetite:  Good  Current Medications: Current Facility-Administered Medications  Medication Dose Route Frequency Provider Last Rate Last Admin   acetaminophen (TYLENOL) tablet 650 mg  650 mg Oral Q6H PRN Maryagnes Amos, FNP   650 mg at 02/22/21 1256   alum & mag hydroxide-simeth (MAALOX/MYLANTA) 200-200-20 MG/5ML suspension 30 mL  30 mL Oral Q4H PRN Maryagnes Amos, FNP       buPROPion (WELLBUTRIN XL) 24 hr tablet 150 mg  150 mg Oral Daily Mason Jim, Rheta Hemmelgarn E, MD   150 mg at 02/22/21 0350   docusate sodium (COLACE) capsule 100 mg  100 mg Oral Daily Mason Jim, Alphia Behanna E, MD   100 mg at 02/22/21 0938   gabapentin (NEURONTIN) capsule 300 mg  300 mg Oral BID Bartholomew Crews E, MD   300 mg at 02/22/21 0820   gabapentin (NEURONTIN) capsule 600 mg  600 mg Oral QHS Mason Jim, March Joos E, MD   600 mg at 02/21/21 2101   hydrOXYzine (ATARAX) tablet 25 mg  25 mg Oral Q6H PRN  Massengill, Harrold Donath, MD       loperamide (IMODIUM) capsule 2-4 mg  2-4 mg Oral PRN Massengill, Harrold Donath, MD       LORazepam (ATIVAN) tablet 1 mg  1 mg Oral Q6H PRN Massengill, Nathan, MD       magnesium hydroxide (MILK OF MAGNESIA) suspension 30 mL  30 mL Oral Daily PRN Maryagnes Amos, FNP   30 mL at 02/21/21 1442   nicotine (NICODERM CQ - dosed in mg/24 hours) patch 14 mg  14 mg Transdermal Daily Eliseo Gum B, MD   14 mg at 02/22/21 1829   nitrofurantoin (macrocrystal-monohydrate) (MACROBID) capsule 100 mg  100 mg Oral Q12H Mataio Mele E, MD   100 mg at 02/22/21 0820   ondansetron (ZOFRAN-ODT) disintegrating tablet 4 mg  4 mg Oral Q6H PRN Massengill, Harrold Donath, MD       valACYclovir (VALTREX) tablet 500 mg  500 mg Oral Daily PRN Maryagnes Amos, FNP       venlafaxine XR (EFFEXOR-XR) 24 hr capsule 75 mg  75 mg Oral Daily Maryagnes Amos, FNP   75 mg at 02/22/21 0820    Lab Results: No results found for this or any previous visit (from the past 48 hour(s)).  Blood Alcohol level:  Lab Results  Component Value Date   ETH <10 02/17/2021    Metabolic Disorder Labs: Lab Results  Component Value Date   HGBA1C 4.6 (L) 02/19/2021   MPG 85.32 02/19/2021   No results found for: PROLACTIN Lab Results  Component Value Date   CHOL 170 02/19/2021   TRIG 90 02/19/2021   HDL 43 02/19/2021   CHOLHDL 4.0 02/19/2021   VLDL 18 02/19/2021   LDLCALC 109 (H) 02/19/2021   LDLCALC 75 01/24/2011    Physical Findings: AIMS: Facial and Oral Movements Muscles of Facial Expression: None, normal Lips and Perioral Area: None, normal Jaw: None, normal Tongue: None, normal,Extremity Movements Upper (arms, wrists, hands, fingers): None, normal Lower (legs, knees, ankles, toes): None, normal, Trunk Movements Neck, shoulders, hips: None,  normal, Overall Severity Severity of abnormal movements (highest score from questions above): None, normal Incapacitation due to abnormal movements:  None, normal Patient's awareness of abnormal movements (rate only patient's report): No Awareness, Dental Status Current problems with teeth and/or dentures?: No Does patient usually wear dentures?: No  CIWA:  CIWA-Ar Total: 4     Musculoskeletal: Strength & Muscle Tone: within normal limits Gait & Station: normal Patient leans: N/A  Psychiatric Specialty Exam:  Presentation  General Appearance: Appropriate for Environment; Casual  Eye Contact:Good  Speech:Clear and Coherent  Speech Volume:Normal  Handedness:Right   Mood and Affect  Mood:more euthymic  Affect:Tearful at times when discussing parents but less constricted overall   Thought Process  Thought Processes:goal directed, linear  Descriptions of Associations:Intact  Orientation:Full (Time, Place and Person)  Thought Content:Logical - denies AVH, paranoia or delusions  Hallucinations:Denied  Ideas of Reference:None  Suicidal Thoughts:Denied  Homicidal Thoughts:Denied  Sensorium  Memory:Immediate Good; Recent Good; Remote Good  Judgment:-- (Improving)  Insight:-- (Improving)   Executive Functions  Concentration:Fair  Attention Span:Fair  Recall:Fair  Progress EnergyFund of Knowledge:Good  Language:Good   Psychomotor Activity  Psychomotor Activity:Normal  Assets  Assets:Resilience; Research scientist (medical)ocial Support; Desire for Improvement   Sleep  Total time unrecorded  Physical Exam Vitals and nursing note reviewed.  Constitutional:      General: She is not in acute distress.    Appearance: Normal appearance. She is obese. She is not ill-appearing or toxic-appearing.  HENT:     Head: Normocephalic and atraumatic.  Pulmonary:     Effort: Pulmonary effort is normal.  Musculoskeletal:        General: Normal range of motion.  Neurological:     General: No focal deficit present.     Mental Status: She is alert.   Review of Systems  Respiratory:  Negative for cough and shortness of breath.    Cardiovascular:  Negative for chest pain.  Gastrointestinal:  Negative for abdominal pain, constipation, diarrhea, nausea and vomiting.  Genitourinary:  Negative for dysuria, flank pain and urgency.  Neurological:  Negative for dizziness, weakness and headaches.  Psychiatric/Behavioral:  Negative for depression, hallucinations and suicidal ideas. The patient is not nervous/anxious.   Blood pressure 127/68, pulse 85, temperature 98 F (36.7 C), temperature source Oral, resp. rate 18, height 5\' 2"  (1.575 m), weight 95.3 kg, last menstrual period 02/08/2021, SpO2 100 %. Body mass index is 38.41 kg/m.   Treatment Plan Summary: Daily contact with patient to assess and evaluate symptoms and progress in treatment and Medication management  Daisy Hoffman  Blok is a 46 yr old female who [facility] a Suicide Attempt via OD (12-20 tablets of 5 mg Xanax) [facility].  PPHx is significant for Depression and Anxiety.   Daisy Hoffman has responded well to the Wellbutrin without any signs of seizure.  She does not want to further increase her Wellbutrin today  and is wanting to D/C tomorrow to be with her boyfriend and daughter [place].  Since her 46 yr old daughter is the person who found her Social Work has contacted CPS.  We will await CPS determination for possibility of discharge tomorrow.  We will not make any medication changes at this time.  We will continue to monitor.     MDD recurrent severe without psychotic features Binge eating d/o by hx: -Continue Wellbutrin XL 150 mg daily (titrating up slowly to avoid potential seizure risk given  recent benzodiazepine overdose) -Continue Effexor XR 75 mg daily  -Continue Gabapentin 300 mg BID and 600 mg QHS for anxiety, sleep and to mitigate risk of seizures after recent benzodiazepine overdose -Hold Vyvanse for binge eating (will speak to outpatient provider about this med after  discharge) -Continue CIWA monitoring for potential benzodiazepine withdrawal after recent overdose (recent scores 2/6- 2,4)   UTI -Urine culture shows >100,000CFU E coli -Continue Macrobid 100 mg BID for 7 days (Day 3 of 7) -Pyridium (completed)   Constipation (resolved): -Continue Colace 100 mg daily -Continue PRN Milk of Magnesia   Abnormal EKG: -Repeat EKG shows Q waves in III and avF but not anterolateral changes as seen on admission - patient asymptomatic and made aware of EKG changes -Follow up with PCP after discharge   Nicotine Dependence: -Continue Nicotine Patch 14 mg daily   -Continue PRN's: Tylenol, Maalox, Atarax, Trazodone   Lauro FranklinAlexander S Pashayan, MD 02/22/2021, 4:36 PM

## 2021-02-22 NOTE — Progress Notes (Signed)
Adult Psychoeducational Group Note  Date:  02/22/2021 Time:  10:01 AM  Group Topic/Focus:  Goals Group:   The focus of this group is to help patients establish daily goals to achieve during treatment and discuss how the patient can incorporate goal setting into their daily lives to aide in recovery.  Participation Level:  Active  Participation Quality:  Appropriate  Affect:  Appropriate  Cognitive:  Appropriate  Insight: Appropriate  Engagement in Group:  Engaged  Modes of Intervention:  Discussion  Additional Comments:  Patient attended morning orientation/goal group and participated.   Yahel Fuston W Pace Lamadrid 02/22/2021, 10:01 AM

## 2021-02-22 NOTE — Group Note (Signed)
LCSW Group Therapy Note  Group Date: 02/22/2021 Start Time: 1300 End Time: 1400   Type of Therapy and Topic:  Group Therapy - Healthy vs Unhealthy Coping Skills  Participation Level:  Active   Description of Group The focus of this group was to determine what unhealthy coping techniques typically are used by group members and what healthy coping techniques would be helpful in coping with various problems. Patients were guided in becoming aware of the differences between healthy and unhealthy coping techniques. Patients were asked to identify 2-3 healthy coping skills they would like to learn to use more effectively.  Therapeutic Goals Patients learned that coping is what human beings do all day long to deal with various situations in their lives Patients defined and discussed healthy vs unhealthy coping techniques Patients identified their preferred coping techniques and identified whether these were healthy or unhealthy Patients determined 2-3 healthy coping skills they would like to become more familiar with and use more often. Patients provided support and ideas to each other   Summary of Patient Progress:  During group, Pt expressed that prior to hospitalization she used her "adult time outs" to cope. Patient proved open to input from peers and feedback from [place]. Patient demonstrated insight into the subject matter, was respectful of peers, and participated throughout the entire session.   Therapeutic Modalities Cognitive Behavioral Therapy Motivational Interviewing  Mliss Fritz, Latanya Presser 02/22/2021  1:16 PM

## 2021-02-22 NOTE — Group Note (Signed)
Recreation Therapy Group Note   Group Topic:Stress Management  Group Date: 02/22/2021 Start Time: 0930 End Time: 0950 Facilitators: Caroll Rancher, LRT,CTRS Location: [address] Dayroom   Goal Area(s) Addresses:  Patient will actively participate in stress management techniques presented during session.  Patient will successfully identify benefit of practicing stress management post d/c.   Group Description: Guided Imagery. LRT provided education, instruction, and demonstration on practice of visualization via guided imagery. Patient was asked to participate in the technique introduced during session. LRT debriefed including topics of mindfulness, stress management and specific scenarios each patient could use these techniques. Patients were given suggestions of ways to access scripts post d/c and encouraged to explore Youtube and other apps available on smartphones, tablets, and computers.   Affect/Mood: Appropriate   Participation Level: Active   Participation Quality: Independent   Behavior: Appropriate   Speech/Thought Process: Focused   Insight: Good   Judgement: Good   Modes of Intervention: Script, Ambient Sounds   Patient Response to Interventions:  Attentive   Education Outcome:  Acknowledges education and In group clarification offered    Clinical Observations/Individualized Feedback: Pt attended and participated in group activity.    Plan: Continue to engage patient in RT group sessions 2-3x/week.   Caroll Rancher, LRT,CTRS 02/22/2021 10:46 AM

## 2021-02-23 MED ORDER — NICOTINE 14 MG/24HR TD PT24: 14.0000 mg | MEDICATED_PATCH | Freq: Every day | TRANSDERMAL | 0 refills | Status: DC

## 2021-02-23 MED ORDER — GABAPENTIN 300 MG PO CAPS
600.0000 mg | ORAL_CAPSULE | Freq: Every day | ORAL | 0 refills | Status: DC
Start: 2021-02-23 — End: 2021-08-13

## 2021-02-23 MED ORDER — VENLAFAXINE HCL ER 75 MG PO CP24: 75.0000 mg | ORAL_CAPSULE | Freq: Every day | ORAL | 0 refills | Status: DC

## 2021-02-23 MED ORDER — GABAPENTIN 300 MG PO CAPS
300.0000 mg | ORAL_CAPSULE | Freq: Two times a day (BID) | ORAL | 0 refills | Status: DC
Start: 2021-02-24 — End: 2021-08-13

## 2021-02-23 MED ORDER — NITROFURANTOIN MONOHYD MACRO 100 MG PO CAPS: 100.0000 mg | ORAL_CAPSULE | Freq: Two times a day (BID) | ORAL | 0 refills | Status: AC

## 2021-02-23 NOTE — Progress Notes (Signed)
°  [facility] Adult Case Management Discharge Plan :  Will you be returning to the same living situation after discharge:  No. Patient will be living with her mom and dad until stabilized with her daughter At discharge, do you have transportation home?: Yes,  boyfriend will be picking up Do you have the ability to pay for your medications: Yes,  insurance  Release of information consent forms completed and in the chart;  Patient's signature needed at discharge.  Patient to Follow up at:  Follow-up [place]. Go on 02/24/2021.   Specialty: Behavioral Health Why: Your psychiatric appointment with Eino Farber is scheduled on 03/03/21 at 9:20 am.  You also have an appointment for therapy services on  02/24/21 at 2:00 pm.  *These appointments will be held  [place]. Minette Brine information: [address] [place] [id number] [phone]                 Next level of care provider has access to [place] and Suicide Prevention discussed: Yes,  with boyfriend     Has patient been referred to the Quitline?: N/A patient is not a smoker  Patient has been referred for addiction treatment: [place], LCSW 02/23/2021, 4:33 PM

## 2021-02-23 NOTE — Progress Notes (Signed)
Discharge note: RN met with pt and reviewed pt's discharge instructions. Pt verbalized understanding of discharge instructions and pt did not have any questions. RN reviewed and provided pt with a copy of SRA, AVS and Transition Record. RN returned pt's belongings to pt. Pt had happy tears when she saw jewelry she thought she lost. Pt happy to go home. Pt denied SI/HI/AVH and voiced no concerns. Pt was appreciative of the care pt received at [facility]. Patient discharged to the lobby without incident.

## 2021-02-23 NOTE — BHH Counselor (Addendum)
CSW attempted to call patient caseorker, Delorise Shiner, and left a HIPPA compliant voicemail for a call back regarding discharge.    Addendum: CSW spoke with Luvenia Starch, LCSW, LCAS Clincal Social Worker  [facility]

## 2021-02-23 NOTE — BHH Counselor (Signed)
CPS caseworker came and interviewed patient. They have agreed that patient can be released but that patient and daughter will be staying with grandmother/grandfather.  Patient has been agreeable to this.     Kayton Dunaj, LCSW, LCAS Clincal Social Worker  [facility]
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
## 2021-02-23 NOTE — [facility] Suicide Risk Assessment (Signed)
CSW received voicemail and case was assigned to CPS caseworker, Delorise Shiner [phone].    Rhayne Chatwin, LCSW, LCAS Clincal Social Worker  [facility]

## 2021-02-23 NOTE — [facility] Suicide Risk Assessment (Signed)
[facility] INPATIENT:  Family/Significant Other Suicide Prevention Education  Suicide Prevention Education:  Education Completed; [facility] Felt, [phone]    ,  (name of family member/significant other) has been identified by the patient as the family member/significant other with whom the patient will be residing, and identified as the person(s) who will aid the patient in the event of a mental health crisis (suicidal ideations/suicide attempt).  With written consent from the patient, the family member/significant other has been provided the following suicide prevention education, prior to the and/or following the discharge of the patient.  CSW spoke with boyfriend who reports that patient had some financial stressors that he believes triggered the suicide attempt.  Boyfriend did not have any other information about what could lead to attempt.  Boyfriend reports that he has been visiting nightly and feels like she has improved.  Boyfriend has talked with her and reports that patient will likely stay with him after discharge. Boyfriend reports that patient does not have access to guns/weapons.  Boyfriend reports that patient and him understand that they will get rid of all medications.  Boyfriend has agreed to pick patient up at discharge.     The suicide prevention education provided includes the following: Suicide risk factors Suicide prevention and interventions National Suicide Hotline telephone number [facility] assessment telephone number [facility] Emergency Assistance 911 [facility] and/or Residential Mobile Crisis Unit telephone number  Request made of family/significant other to: Remove weapons (e.g., guns, rifles, knives), all items previously/currently identified as safety concern.   Remove drugs/medications (over-the-counter, prescriptions, illicit drugs), all items previously/currently identified as a safety concern.  The family member/significant other  verbalizes understanding of the suicide prevention education information provided.  The family member/significant other agrees to remove the items of safety concern listed above.  Diona Peregoy E Sathvika Ojo 02/23/2021, 8:12 AM

## 2021-02-23 NOTE — [facility] Group Notes (Signed)
Pt attended and contributed to goals group. °

## 2021-02-23 NOTE — Progress Notes (Signed)
Pt denies SI/HI/AVH and verbally agrees to approach staff if these become apparent or before harming themselves/others. Rates depression 0/10. Rates anxiety 0/10. Rates pain 5/10.  Pt stated that she had the best sleep last night and that she woke up in a much happier mood which does not really happen at home. Pt has had a much brighter affect today. Scheduled medications administered to pt, per MD orders. RN provided support and encouragement to pt. Q15 min safety checks implemented and continued. Pt safe on the unit. RN will continue to monitor and intervene as needed.   02/23/21 0813  Psych Admission Type (Psych Patients Only)  Admission Status Involuntary  Psychosocial Assessment  Patient Complaints None  Eye Contact Fair  Facial Expression Animated  Affect Appropriate to circumstance;Euphoric  Speech Logical/coherent  Interaction Assertive  Motor Activity Other (Comment) (WDL)  Appearance/Hygiene Unremarkable  Behavior Characteristics Cooperative;Appropriate to situation;Calm  Mood Pleasant;Euthymic  Thought Process  Coherency WDL  Content Blaming self  Delusions None reported or observed  Perception WDL  Hallucination None reported or observed  Judgment WDL  Confusion None  Danger to Self  Current suicidal ideation? Denies  Danger to Others  Danger to Others None reported or observed

## 2021-02-23 NOTE — Progress Notes (Signed)
°   02/22/21 2130  Psych Admission Type (Psych Patients Only)  Admission Status Involuntary  Psychosocial Assessment  Patient Complaints Anxiety  Eye Contact Fair  Facial Expression Anxious;Worried  Affect Anxious;Depressed  Surveyor, minerals Activity Other (Comment) (wnl)  Appearance/Hygiene Unremarkable  Behavior Characteristics Cooperative;Appropriate to situation;Anxious  Mood Anxious;Pleasant  Thought Process  Coherency WDL  Content Blaming self  Delusions None reported or observed  Perception WDL  Hallucination None reported or observed  Judgment WDL  Confusion None  Danger to Self  Current suicidal ideation? Denies  Danger to Others  Danger to Others None reported or observed   Pt seen after group this evening. Pt denies SI, HI, AVH and pain. Pt endorses anxiety 3/10 and denies depression. Pt is worried about losing her daughter. Her daughter is 39 but will be 18 in six weeks. "I talked to my parents and they said that she can stay with them. She's really 17 1/2 so by the time DSS takes care of things she will be 18 and she can come back home herself. It's been she and I for a long time. We take care of each other. She mothers me when she has to. She even bought me a pill organizer just like the one I got her for her medicines."  Pt states that she was constipated earlier but had a bowel movement today. Pt denies any withdrawal symptoms.

## 2021-02-23 NOTE — Group Note (Signed)
Recreation Therapy Group Note   Group Topic:Animal Assisted Therapy   Group Date: 02/23/2021 Start Time: 1430 End Time: 1510 Facilitators: Caroll Rancher, LRT,CTRS Location: [address] Day[address]   Animal-Assisted Activity (AAA) Program Checklist/Progress Note Patient Eligibility Criteria Checklist & Daily Group note for Rec Tx Intervention  AAA/T Program Assumption of Risk Form signed by Patient/ or Parent Legal Guardian YES  Patient is free of allergies or severe asthma  YES  Patient reports no fear of animals YES  Patient reports no history of cruelty to animals YES  Patient understands their participation is voluntary YES  Patient washes hands before animal contact YES  Patient washes hands after animal contact YES  Group Description: Patients provided opportunity to interact with trained and credentialed [employer] Therapy dog and the community volunteer/dog handler. Patients practiced appropriate animal interaction and were educated on dog safety outside of the hospital in common community settings. Patients were allowed to use dog toys and other items to practice commands, engage the dog in play, and/or complete routine aspects of animal care.   Education: Charity fundraiser, Health visitor, Communication & Social Skills    Affect/Mood: Appropriate   Participation Level: Engaged    Clinical Observations/Individualized Feedback: Pt attended, participated and shared stories of their animals at home.  Pt also engaged with dog team by asking questions to gain information about dog training, service dogs and therapy dogs.    Plan: Continue to engage patient in RT group sessions 2-3x/week.   Caroll Rancher, Antonietta Jewel 02/23/2021 3:18 PM

## 2021-02-23 NOTE — Discharge Summary (Signed)
Physician Discharge Summary Note  Patient:  Daisy Hoffman is an 46 y.o., female MRN:  [id number] DOB:  10/03/1975 Patient phone:  [phone] (home)  Patient address:   [address] [place] [phone],  Total Time spent with patient: 30 minutes  Date of Admission:  02/18/2021 Date of Discharge: 02/23/2021  Reason for Admission:   Daisy Mentionmanda  Hoffman is a 46 yr old female who [facility] after a Suicide Attempt via OD (12-20 tablets of 5 mg Xanax) [facility].  PPHx is significant for Depression and Anxiety.  She reported that this was not a suicide attempt but a cry for help.  She also reported that she had left a note to her family and sent a goodbye message to her boyfriend.  Principal Problem: MDD (major depressive disorder), recurrent episode (HCC) Discharge Diagnoses: Principal Problem:   MDD (major depressive disorder), recurrent episode (HCC) Active Problems:   UTI (urinary tract infection)   Past Psychiatric History: Depression and Anxiety.  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, 11/2006, 02/2007, 08/2008.,02/2008   C&B LGSIL 11/2005, C&B 05/2006 PAP LGSIL A FEW HIGHER,   Restless leg syndrome    Snores     Past Surgical History:  Procedure Laterality Date   CARPAL TUNNEL RELEASE Right 04/28/2016   Procedure: RIGHT CARPAL TUNNEL RELEASE;  Surgeon: Betha LoaKevin Kuzma, MD;  Location: [facility];  Service: Orthopedics;  Laterality: Right;   CARPAL TUNNEL RELEASE Left 06/16/2016   Procedure: LEFT CARPAL TUNNEL RELEASE;  Surgeon: Betha LoaKuzma, Kevin, MD;  Location: [facility];  Service: Orthopedics;  Laterality: Left;   FOOT SURGERY  1997   INTRAUTERINE DEVICE INSERTION  10/07/2013   Mirena    LAPAROSCOPIC GASTRIC BANDING  04/2008   Family History:  Family History  Problem Relation Age of Onset   Diabetes Father    Hypertension Father    Osteoporosis Mother    Hypertension Mother    Family Psychiatric  History: Maternal Grandmother- OD on opioids, concern for Bipolar Disorder and Schizophrenia. Daughter- Multiple dx from Neuro and Psych, history of TBI Social History:  Social History   Substance and Sexual Activity  Alcohol Use Yes   Comment: rarely     Social History   Substance and Sexual Activity  Drug Use No    Social History   Socioeconomic History   Marital status: Divorced    Spouse name: Not on file   Number of children: 1   Years of education: Not on file   Highest education level: Associate degree: academic program  Occupational History   Not on file  Tobacco Use   Smoking status: Former    Packs/day: 1.00    Years: 22.00    Pack years: 22.00    Types: Cigarettes    Start date: 01/25/2012    Quit date: 07/27/2012    Years since quitting: 8.5   Smokeless tobacco: Never   Tobacco comments:    vapes with nicotene daily  Vaping Use   Vaping Use: Every day   Substances: Nicotine, CBD, Flavoring  Substance and Sexual Activity   Alcohol use: Yes    Comment: rarely   Drug use: No  Sexual activity: Not on file    Comment: Mirena 10/07/2013  Other Topics Concern   Not on file  Social History Narrative   Lives with daughter   Caffeine- coffee 5 cups daily   Social Determinants of Health   Financial Resource Strain: Not on file  Food Insecurity: Not on file  Transportation Needs: Not on file  Physical Activity: Not on file  Stress: Not on file  Social Connections: Not on file    Hospital Course:   During hospitalization, patient was evaluated by the psychiatric team.  They received multiple disciplinary care.  After initial intake evaluation, it was decided to adjust medications. Her Wellbutrin was initially held due to the OD on Xanax  and risk of seizures.  Her Vyvanse was held and her home Effexor was continued.  After observation for an additional day her Wellbutrin was restarted.  She tolerated her medications well. Patient not having side effects to psychiatric medications.   Patient received supportive psychotherapy and was encouraged to participate in group therapy during the hospitalization. Patient denied having suicidal thoughts more than 48 hours prior to discharge.  On her initial day in the hospital she did have significant flank pain and decreased urine output.  After evaluation in the ED she was started on 7 days of Macrobid and 2 days of Pyridium for acute cystitis.  She had no issues with the antibiotics and resolution of her symptoms.  Patient reports that well last night.  She reports that her appetite is doing good.  She reports no SI, HI, or AVH.  She reports no paranoia, is reckless, with first rank symptoms.  She reports that she is feeling much better and is looking forward to discharge.  The CPS came to the unit and interviewed her.  Their recommendations were that patient's daughter stay with patient's parents which she had already been planning on.  She states that she will go with her boyfriend to [place] while he is working a job there.  She reports no issues with her medications.  Discussed importance of completing her course of Macrobid.  Discussed that if she has symptoms of significant diarrhea that she needs to be evaluated.  She reported understanding with the plan.  Discussed with patient what to do in the event of a future crisis.  Discussed that she can return to [facility], go to the [facility], go to the nearest ED, or call 911 or [phone].  She reported understanding and had no concerns.  She was discharged home.    Physical Findings:   Musculoskeletal: Strength & Muscle Tone: within normal limits Gait & Station: normal Patient leans: N/A   Psychiatric Specialty Exam:  Presentation  General  Appearance: Appropriate for Environment; Casual; Fairly Groomed  Eye Contact:Good  Speech:Clear and Coherent; Normal Rate  Speech Volume:Normal  Handedness:Right   Mood and Affect  Mood:Euthymic  Affect:Appropriate; Congruent   Thought Process  Thought Processes:Coherent; Goal Directed  Descriptions of Associations:Intact  Orientation:Full (Time, Place and Person)  Thought Content:Logical  History of Schizophrenia/Schizoaffective disorder:No data recorded Duration of Psychotic Symptoms:No data recorded Hallucinations:Hallucinations: None  Ideas of Reference:None  Suicidal Thoughts:Suicidal Thoughts: No  Homicidal Thoughts:Homicidal Thoughts: No   Sensorium  Memory:Immediate Good; Recent Good  Judgment:Good  Insight:Good   Executive Functions  Concentration:Good  Attention Span:Good  Recall:Good  Fund of Knowledge:Good  Language:Good   Psychomotor Activity  Psychomotor Activity:Psychomotor Activity: Normal   Assets  Assets:Resilience; Desire for Improvement; Social Support   Sleep  Sleep:Sleep: Good  Number of Hours of Sleep: 6.75    Physical Exam: Physical Exam Vitals and nursing note reviewed.  Constitutional:      General: She is not in acute distress.    Appearance: Normal appearance. She is normal weight. She is not ill-appearing or toxic-appearing.  HENT:     Head: Normocephalic and atraumatic.  Pulmonary:     Effort: Pulmonary effort is normal.  Musculoskeletal:        General: Normal range of motion.  Neurological:     General: No focal deficit present.     Mental Status: She is alert.   Review of Systems  Respiratory:  Negative for cough and shortness of breath.   Cardiovascular:  Negative for chest pain.  Gastrointestinal:  Negative for abdominal pain, constipation, diarrhea, nausea and vomiting.  Neurological:  Negative for dizziness, weakness and headaches.  Psychiatric/Behavioral:  Negative for depression,  hallucinations and suicidal ideas. The patient is not nervous/anxious.   Blood pressure 126/83, pulse 96, temperature 97.6 F (36.4 C), resp. rate 17, height 5\' 2"  (1.575 m), weight 95.3 kg, last menstrual period 02/08/2021, SpO2 96 %. Body mass index is 38.41 kg/m.   Social History   Tobacco Use  Smoking Status Former   Packs/day: 1.00   Years: 22.00   Pack years: 22.00   Types: Cigarettes   Start date: 01/25/2012   Quit date: 07/27/2012   Years since quitting: 8.5  Smokeless Tobacco Never  Tobacco Comments   vapes with nicotene daily   Tobacco Cessation:  A prescription for an FDA-approved tobacco cessation medication provided at discharge   Blood Alcohol level:  Lab Results  Component Value Date   ETH <10 02/17/2021    Metabolic Disorder Labs:  Lab Results  Component Value Date   HGBA1C 4.6 (L) 02/19/2021   MPG 85.32 02/19/2021   No results found for: PROLACTIN Lab Results  Component Value Date   CHOL 170 02/19/2021   TRIG 90 02/19/2021   HDL 43 02/19/2021   CHOLHDL 4.0 02/19/2021   VLDL 18 02/19/2021   LDLCALC 109 (H) 02/19/2021   LDLCALC 75 01/24/2011    See Psychiatric Specialty Exam and Suicide Risk Assessment completed by Attending Physician prior to discharge.  Discharge destination:  Home  Is patient on multiple antipsychotic therapies at discharge:  No   Has Patient had three or more failed trials of antipsychotic monotherapy by history:  No  Recommended Plan for Multiple Antipsychotic Therapies: NA  Discharge Instructions     Diet - low sodium heart healthy   Complete by: As directed    Increase activity slowly   Complete by: As directed       Allergies as of 02/23/2021       Reactions   Omnicef [cefdinir] Anaphylaxis   Penicillins Rash        Medication List     STOP taking these medications    ALPRAZolam 0.5 MG tablet Commonly known as: XANAX   lisdexamfetamine 40 MG capsule Commonly known as: VYVANSE   magnesium (amino  acid chelate) 133 MG tablet   Venlafaxine HCl 75 MG Tb24 Replaced by: venlafaxine XR 75 MG 24 hr capsule       TAKE these medications      Indication  buPROPion 150 MG 24 hr tablet Commonly known as: WELLBUTRIN XL Take 150 mg by mouth daily.  Indication: Major Depressive Disorder   gabapentin 300 MG capsule Commonly known as: NEURONTIN Take 2 capsules (600 mg total) by mouth at  bedtime. What changed: how much to take  Indication: Restless Leg Syndrome, Social Anxiety Disorder   gabapentin 300 MG capsule Commonly known as: NEURONTIN Take 1 capsule (300 mg total) by mouth 2 (two) times daily. Start taking on: February 24, 2021 What changed: You were already taking a medication with the same name, and this prescription was added. Make sure you understand how and when to take each.  Indication: Restless Leg Syndrome, Social Anxiety Disorder   nicotine 14 mg/24hr patch Commonly known as: NICODERM CQ - dosed in mg/24 hours Place 1 patch (14 mg total) onto the skin daily. Start taking on: February 24, 2021  Indication: Nicotine Addiction   nitrofurantoin (macrocrystal-monohydrate) 100 MG capsule Commonly known as: MACROBID Take 1 capsule (100 mg total) by mouth every 12 (twelve) hours for 4 days.  Indication: Simple Infection of the Urinary Tract   phenazopyridine 200 MG tablet Commonly known as: PYRIDIUM Take 1 tablet (200 mg total) by mouth 3 (three) times daily.    valACYclovir 500 MG tablet Commonly known as: VALTREX Take 500 mg by mouth daily as needed (for cold sores until clear).  Indication: Cold Sores   venlafaxine XR 75 MG 24 hr capsule Commonly known as: EFFEXOR-XR Take 1 capsule (75 mg total) by mouth daily. Start taking on: February 24, 2021 Replaces: Venlafaxine HCl 75 MG Tb24  Indication: Major Depressive Disorder   Zovirax 5 % Generic drug: acyclovir cream Apply 1 application topically as needed (for cold sores).  Indication: Cold Sores         Follow-up Information     [facility]. Go on 02/24/2021.   Specialty: Behavioral Health Why: Your psychiatric appointment with Tamela Oddi is scheduled on 03/03/21 at 9:20 am.  You also have an appointment for therapy services on  02/24/21 at 2:00 pm.  *These appointments will be held  IN PERSON. Benay Pillow information: [address] [place] [address] [phone]                 Follow-up recommendations:   - Activity as tolerated. - Diet as recommended by PCP. - Keep all scheduled follow-up appointments as recommended.  Comments:   Patient is instructed to take all prescribed medications as recommended. Report any side effects or adverse reactions to your outpatient psychiatrist. Patient is instructed to abstain from alcohol and illegal drugs while on prescription medications. In the event of worsening symptoms, patient is instructed to call the crisis hotline, 911, or go to the nearest emergency department for evaluation and treatment.  Signed: Lauro Franklin, MD 02/23/2021, 4:47 PM

## 2021-02-28 NOTE — [facility] Group Notes (Signed)
Spiritual care group on grief and loss facilitated by chaplain Dyanne Carrel, [facility]   Group Goal:   Support / Education around grief and loss   Members engage in facilitated group support and psycho-social education.   Group Description:   Following introductions and group rules, group members engaged in facilitated group dialog and support around topic of loss, with particular support around experiences of loss in their lives. Group Identified types of loss (relationships / self / things) and identified patterns, circumstances, and changes that precipitate losses. Reflected on thoughts / feelings around loss, normalized grief responses, and recognized variety in grief experience. Group noted Worden's four tasks of grief in discussion.   Group drew on Adlerian / Rogerian, narrative, MI,   Patient Progress: Daisy Hoffman attended group and was actively engaged in the conversation for the entirety of the session.  [address], Bcc Pager, [phone]

## 2021-07-16 ENCOUNTER — Other Ambulatory Visit: Payer: Self-pay | Admitting: Family Medicine

## 2021-07-16 DIAGNOSIS — E669 Obesity, unspecified: Secondary | ICD-10-CM | POA: Insufficient documentation

## 2021-07-16 DIAGNOSIS — Z1231 Encounter for screening mammogram for malignant neoplasm of breast: Secondary | ICD-10-CM

## 2021-07-16 DIAGNOSIS — T424X2A Poisoning by benzodiazepines, intentional self-harm, initial encounter: Secondary | ICD-10-CM | POA: Insufficient documentation

## 2021-08-13 ENCOUNTER — Encounter: Payer: Self-pay | Admitting: Cardiology

## 2021-08-13 ENCOUNTER — Ambulatory Visit: Payer: Medicaid Other | Admitting: Cardiology

## 2021-08-13 VITALS — BP 90/60 | HR 76 | Ht 62.0 in | Wt 208.8 lb

## 2021-08-13 DIAGNOSIS — R079 Chest pain, unspecified: Secondary | ICD-10-CM | POA: Diagnosis not present

## 2021-08-13 DIAGNOSIS — R9431 Abnormal electrocardiogram [ECG] [EKG]: Secondary | ICD-10-CM

## 2021-08-13 NOTE — Progress Notes (Signed)
Cardiology Office Note:    Date:  08/13/2021   ID:  Lequita Halt, DOB [birth year], MRN [id number]  PCP:  [facility]   [facility] Providers Cardiologist:  None     Referring MD: [facility]    History of Present Illness:    Daisy Hoffman is a 46 y.o. female here for evaluation of abnormal ECG. SR with inferior infarct pattern at the request of Blair Dolphin, NP.  Repeat EKG today shows inferior infarct pattern as well as poor R wave progression.  She has had occasional chest discomfort.  She has felt some chest pain/GERD with lap band in the past.  After COVID vaccine, chest pain, pale. Urgent care, 911. ER visit was ok.  No evidence of acute coronary syndrome.  She works as a Psychologist, sport and exercise.  Quit cigs in 2011. Now Vapes to 6% nicotine  Father's mother had MI's and CABG  Past Medical History:  Diagnosis Date   Abnormal EKG    Allergy    Anemia    Anxiety    Depression    GERD (gastroesophageal reflux disease)    occ, due to gastric lapband   Headache    LGSIL (low grade squamous intraepithelial dysplasia) 11/2005, 11/2006, 02/2007, 08/2008.,02/2008   C&B LGSIL 11/2005, C&B 05/2006 PAP LGSIL A FEW HIGHER,   Restless leg syndrome    Snores     Past Surgical History:  Procedure Laterality Date   CARPAL TUNNEL RELEASE Right 04/28/2016   Procedure: RIGHT CARPAL TUNNEL RELEASE;  Surgeon: Leanora Cover, MD;  Location: [place];  Service: Orthopedics;  Laterality: Right;   CARPAL TUNNEL RELEASE Left 06/16/2016   Procedure: LEFT CARPAL TUNNEL RELEASE;  Surgeon: Leanora Cover, MD;  Location: [place];  Service: Orthopedics;  Laterality: Left;   FOOT SURGERY  1997   INTRAUTERINE DEVICE INSERTION  10/07/2013   Mirena   LAPAROSCOPIC GASTRIC BANDING  04/2008    Current Medications: Current Meds  Medication Sig   buPROPion (WELLBUTRIN XL) 150 MG 24 hr tablet Take 150 mg by mouth daily.    lamoTRIgine (LAMICTAL) 25 MG tablet Take 25 mg by mouth daily.   naltrexone (DEPADE) 50 MG tablet Take 25-50 mg by mouth daily.   traZODone (DESYREL) 50 MG tablet TAKE 1-1.5 TABLETS BY MOUTH AT BEDTIME   valACYclovir (VALTREX) 500 MG tablet Take 500 mg by mouth daily as needed (for cold sores until clear).   ZOVIRAX 5 % Apply 1 application topically as needed (for cold sores).   Current Facility-Administered Medications for the 08/13/21 encounter (Office Visit) with Jerline Pain, MD  Medication   levonorgestrel (MIRENA) 20 MCG/24HR IUD     Allergies:   Omnicef [cefdinir] and Penicillins   Social History   Socioeconomic History   Marital status: Divorced    Spouse name: Not on file   Number of children: 1   Years of education: Not on file   Highest education level: Associate degree: academic program  Occupational History   Not on file  Tobacco Use   Smoking status: Former    Packs/day: 1.00    Years: 22.00    Total pack years: 22.00    Types: Cigarettes    Start date: 01/25/2012    Quit date: 07/27/2012    Years since quitting: 9.0   Smokeless tobacco: Never   Tobacco comments:    vapes with nicotene daily  Vaping Use   Vaping Use: Every day  Substances: Nicotine, CBD, Flavoring  Substance and Sexual Activity   Alcohol use: Yes    Comment: rarely   Drug use: No   Sexual activity: Not on file    Comment: Mirena 10/07/2013  Other Topics Concern   Not on file  Social History Narrative   Lives with daughter   Caffeine- coffee 5 cups daily   Social Determinants of Health   Financial Resource Strain: Not on file  Food Insecurity: Not on file  Transportation Needs: Not on file  Physical Activity: Not on file  Stress: Not on file  Social Connections: Not on file     Family History: The patient's family history includes Diabetes in her father; Hypertension in her father and mother; Osteoporosis in her mother.  ROS:   Please see the history of present illness.     No fever chills nausea vomiting all other systems reviewed and are negative.  EKGs/Labs/Other Studies Reviewed:    The following studies were reviewed today: Prior ER visit, lab work reviewed prior EKGs reviewed  EKG:  EKG is  ordered today.  The ekg ordered today demonstrates sinus rhythm 76 with.  Infarct pattern poor R wave progression.  Recent Labs: 02/17/2021: ALT 11 02/19/2021: BUN 13; Creatinine, Ser 0.94; Hemoglobin 14.6; Platelets 252; Potassium 4.3; Sodium 136; TSH 3.385  Recent Lipid Panel    Component Value Date/Time   CHOL 170 02/19/2021 0638   TRIG 90 02/19/2021 0638   HDL 43 02/19/2021 0638   CHOLHDL 4.0 02/19/2021 0638   VLDL 18 02/19/2021 0638   LDLCALC 109 (H) 02/19/2021 0638     Risk Assessment/Calculations:              Physical Exam:    VS:  BP 90/60   Pulse 76   Ht 5\' 2"  (1.575 m)   Wt 208 lb 12.8 oz (94.7 kg)   SpO2 97%   BMI 38.19 kg/m     Wt Readings from Last 3 Encounters:  08/13/21 208 lb 12.8 oz (94.7 kg)  02/17/21 207 lb (93.9 kg)  06/25/20 220 lb (99.8 kg)     GEN:  Well nourished, well developed in no acute distress HEENT: Normal NECK: No JVD; No carotid bruits LYMPHATICS: No lymphadenopathy CARDIAC: RRR, no murmurs, no rubs, gallops RESPIRATORY:  Clear to auscultation without rales, wheezing or rhonchi  ABDOMEN: Soft, non-tender, non-distended MUSCULOSKELETAL:  No edema; No deformity  SKIN: Warm and dry NEUROLOGIC:  Alert and oriented x 3 PSYCHIATRIC:  Normal affect   ASSESSMENT:    1. Chest pain of uncertain etiology   2. Abnormal EKG    PLAN:    In order of problems listed above:  Abnormal EKG Precordial chest pain - EKG demonstrates looks like old inferior infarct pattern and possible anterolateral infarct pattern secondary to poor R wave progression and Q waves in inferior leads.  Multiple EKGs have been performed and they all demonstrate this finding.  She does report that she has had chest discomfort in the past  and since her lap band surgery approximately 11 years ago she has felt occasional GERD symptoms..  Her paternal grandmother had many heart attacks as well as bypass. - I will check an echocardiogram to ensure proper structure and function of her heart - I will also check a cardiac PET scan to see if there is in fact any evidence of infarct pattern or difficulties with coronary flow.   Anxiety - Medications reviewed.  Per psychiatry.  Medication Adjustments/Labs and Tests  Ordered: Current medicines are reviewed at length with the patient today.  Concerns regarding medicines are outlined above.  Orders Placed This Encounter  Procedures   NM PET CT CARDIAC PERFUSION MULTI W/ABSOLUTE BLOODFLOW   EKG 12-Lead   ECHOCARDIOGRAM COMPLETE   No orders of the defined types were placed in this encounter.   Patient Instructions  Medication Instructions:  The current medical regimen is effective;  continue present plan and medications.  *If you need a refill on your cardiac medications before your next appointment, please call your pharmacy*  Testing/Procedures: Your physician has requested that you have an echocardiogram. Echocardiography is a painless test that uses sound waves to create images of your heart. It provides your doctor with information about the size and shape of your heart and how well your heart's chambers and valves are working. This procedure takes approximately one hour. There are no restrictions for this procedure.  How to Prepare for Your Cardiac PET/CT Stress Test:  1. Please do not take these medications before your test:   Medications that may interfere with the cardiac pharmacological stress agent (ex. nitrates - including erectile dysfunction medications or beta-blockers) the day of the exam. (Erectile dysfunction medication should be held for at least 72 hrs prior to test) Theophylline containing medications for 12 hours. Dipyridamole 48 hours prior to the test. Your  remaining medications may be taken with water.  2. Nothing to eat or drink, except water, 3 hours prior to arrival time.   NO caffeine/decaffeinated products, or chocolate 12 hours prior to arrival.  3. NO perfume, cologne or lotion  4. Total time is 1 to 2 hours; you may want to bring reading material for the waiting time.  5. Please report to Admitting at the [facility] Main Entrance 60 minutes early for your test.  [address] [place], [place] [address]  Diabetic Preparation:  Hold oral medications. You may take NPH and Lantus insulin. Do not take Humalog or Humulin R (Regular Insulin) the day of your test. Check blood sugars prior to leaving the house. If able to eat breakfast prior to 3 hour fasting, you may take all medications, including your insulin, Do not worry if you miss your breakfast dose of insulin - start at your next meal.  IF YOU THINK YOU MAY BE PREGNANT, OR ARE NURSING PLEASE INFORM THE TECHNOLOGIST.  In preparation for your appointment, medication and supplies will be purchased.  Appointment availability is limited, so if you need to cancel or reschedule, please call the Radiology Department at [phone]  24 hours in advance to avoid a cancellation fee of $100.00  What to Expect After you Arrive:  Once you arrive and check in for your appointment, you will be taken to a preparation room within the Radiology Department.  A technologist or Nurse will obtain your medical history, verify that you are correctly prepped for the exam, and explain the procedure.  Afterwards,  an IV will be started in your arm and electrodes will be placed on your skin for EKG monitoring during the stress portion of the exam. Then you will be escorted to the PET/CT scanner.  There, staff will get you positioned on the scanner and obtain a blood pressure and EKG.  During the exam, you will continue to be connected to the EKG and blood pressure machines.  A small, safe amount  of a radioactive tracer will be injected in your IV to obtain a series of pictures of your  heart along with an injection of a stress agent.    After your Exam:  It is recommended that you eat a meal and drink a caffeinated beverage to counter act any effects of the stress agent.  Drink plenty of fluids for the remainder of the day and urinate frequently for the first couple of hours after the exam.  Your doctor will inform you of your test results within 7-10 business days.  For questions about your test or how to prepare for your test, please call: Rockwell Alexandria, Cardiac Imaging Nurse Navigator  Larey Brick, Cardiac Imaging Nurse Navigator Office: [phone]     Follow-Up: At [facility], you and your health needs are our priority.  As part of our continuing mission to provide you with exceptional heart care, we have created designated Provider Care Teams.  These Care Teams include your primary Cardiologist (physician) and Advanced Practice Providers (APPs -  Physician Assistants and Nurse Practitioners) who all work together to provide you with the care you need, when you need it.  We recommend signing up for the patient portal called "MyChart".  Sign up information is provided on this After Visit Summary.  MyChart is used to connect with patients for Virtual Visits (Telemedicine).  Patients are able to view lab/test results, encounter notes, upcoming appointments, etc.  Non-urgent messages can be sent to your provider as well.   To learn more about what you can do with MyChart, go to [email or web address].    Your next appointment:   Follow up will be based on the results of the above testing.  Important Information About Sugar         Signed, Donato Schultz, MD  08/13/2021 2:29 PM    [facility]

## 2021-08-13 NOTE — Patient Instructions (Addendum)
Medication Instructions:  The current medical regimen is effective;  continue present plan and medications.  *If you need a refill on your cardiac medications before your next appointment, please call your pharmacy*  Testing/Procedures: Your physician has requested that you have an echocardiogram. Echocardiography is a painless test that uses sound waves to create images of your heart. It provides your doctor with information about the size and shape of your heart and how well your heart's chambers and valves are working. This procedure takes approximately one hour. There are no restrictions for this procedure.  How to Prepare for Your Cardiac PET/CT Stress Test:  1. Please do not take these medications before your test:   Medications that may interfere with the cardiac pharmacological stress agent (ex. nitrates - including erectile dysfunction medications or beta-blockers) the day of the exam. (Erectile dysfunction medication should be held for at least 72 hrs prior to test) Theophylline containing medications for 12 hours. Dipyridamole 48 hours prior to the test. Your remaining medications may be taken with water.  2. Nothing to eat or drink, except water, 3 hours prior to arrival time.   NO caffeine/decaffeinated products, or chocolate 12 hours prior to arrival.  3. NO perfume, cologne or lotion  4. Total time is 1 to 2 hours; you may want to bring reading material for the waiting time.  5. Please report to Admitting at the [facility] Main Entrance 60 minutes early for your test.  [address] [place], [place] [address]  Diabetic Preparation:  Hold oral medications. You may take NPH and Lantus insulin. Do not take Humalog or Humulin R (Regular Insulin) the day of your test. Check blood sugars prior to leaving the house. If able to eat breakfast prior to 3 hour fasting, you may take all medications, including your insulin, Do not worry if you miss your breakfast  dose of insulin - start at your next meal.  IF YOU THINK YOU MAY BE PREGNANT, OR ARE NURSING PLEASE INFORM THE TECHNOLOGIST.  In preparation for your appointment, medication and supplies will be purchased.  Appointment availability is limited, so if you need to cancel or reschedule, please call the Radiology Department at [phone]  24 hours in advance to avoid a cancellation fee of $100.00  What to Expect After you Arrive:  Once you arrive and check in for your appointment, you will be taken to a preparation room within the Radiology Department.  A technologist or Nurse will obtain your medical history, verify that you are correctly prepped for the exam, and explain the procedure.  Afterwards,  an IV will be started in your arm and electrodes will be placed on your skin for EKG monitoring during the stress portion of the exam. Then you will be escorted to the PET/CT scanner.  There, staff will get you positioned on the scanner and obtain a blood pressure and EKG.  During the exam, you will continue to be connected to the EKG and blood pressure machines.  A small, safe amount of a radioactive tracer will be injected in your IV to obtain a series of pictures of your heart along with an injection of a stress agent.    After your Exam:  It is recommended that you eat a meal and drink a caffeinated beverage to counter act any effects of the stress agent.  Drink plenty of fluids for the remainder of the day and urinate frequently for the first couple of hours after the exam.  Your doctor will inform you of your test results within 7-10 business days.  For questions about your test or how to prepare for your test, please call: Rockwell Alexandria, Cardiac Imaging Nurse Navigator  Larey Brick, Cardiac Imaging Nurse Navigator Office: [phone]     Follow-Up: At [facility], you and your health needs are our priority.  As part of our continuing mission to provide you with exceptional heart care,  we have created designated Provider Care Teams.  These Care Teams include your primary Cardiologist (physician) and Advanced Practice Providers (APPs -  Physician Assistants and Nurse Practitioners) who all work together to provide you with the care you need, when you need it.  We recommend signing up for the patient portal called "MyChart".  Sign up information is provided on this After Visit Summary.  MyChart is used to connect with patients for Virtual Visits (Telemedicine).  Patients are able to view lab/test results, encounter notes, upcoming appointments, etc.  Non-urgent messages can be sent to your provider as well.   To learn more about what you can do with MyChart, go to [email or web address].    Your next appointment:   Follow up will be based on the results of the above testing.  Important Information About Sugar

## 2021-08-20 ENCOUNTER — Other Ambulatory Visit: Payer: Self-pay

## 2021-08-20 ENCOUNTER — Emergency Department: Payer: Medicaid Other

## 2021-08-20 ENCOUNTER — Ambulatory Visit (HOSPITAL_COMMUNITY): Payer: Medicaid Other | Attending: Cardiology

## 2021-08-20 DIAGNOSIS — Z5321 Procedure and treatment not carried out due to patient leaving prior to being seen by health care provider: Secondary | ICD-10-CM | POA: Diagnosis not present

## 2021-08-20 DIAGNOSIS — R42 Dizziness and giddiness: Secondary | ICD-10-CM | POA: Diagnosis present

## 2021-08-20 DIAGNOSIS — R9431 Abnormal electrocardiogram [ECG] [EKG]: Secondary | ICD-10-CM | POA: Diagnosis not present

## 2021-08-20 DIAGNOSIS — R0789 Other chest pain: Secondary | ICD-10-CM | POA: Insufficient documentation

## 2021-08-20 DIAGNOSIS — R200 Anesthesia of skin: Secondary | ICD-10-CM | POA: Insufficient documentation

## 2021-08-20 DIAGNOSIS — R1010 Upper abdominal pain, unspecified: Secondary | ICD-10-CM | POA: Insufficient documentation

## 2021-08-20 DIAGNOSIS — R079 Chest pain, unspecified: Secondary | ICD-10-CM | POA: Insufficient documentation

## 2021-08-20 LAB — COMPREHENSIVE METABOLIC PANEL
ALT: 11 U/L (ref 0–44)
AST: 14 U/L — ABNORMAL LOW (ref 15–41)
Albumin: 4.1 g/dL (ref 3.5–5.0)
Alkaline Phosphatase: 109 U/L (ref 38–126)
Anion gap: 11 (ref 5–15)
BUN: 9 mg/dL (ref 6–20)
CO2: 25 mmol/L (ref 22–32)
Calcium: 9 mg/dL (ref 8.9–10.3)
Chloride: 104 mmol/L (ref 98–111)
Creatinine, Ser: 0.82 mg/dL (ref 0.44–1.00)
GFR, Estimated: >60 mL/min (ref >60)
Glucose, Bld: 107 mg/dL — ABNORMAL HIGH (ref 70–99)
Potassium: 3.7 mmol/L (ref 3.5–5.1)
Sodium: 140 mmol/L (ref 135–145)
Total Bilirubin: 0.7 mg/dL (ref 0.3–1.2)
Total Protein: 6.6 g/dL (ref 6.5–8.1)

## 2021-08-20 LAB — CBC WITH DIFFERENTIAL/PLATELET
Abs Immature Granulocytes: 0.03 10*3/uL (ref 0.00–0.07)
Basophils Absolute: 0.1 10*3/uL (ref 0.0–0.1)
Basophils Relative: 1 %
Eosinophils Absolute: 0 10*3/uL (ref 0.0–0.5)
Eosinophils Relative: 1 %
HCT: 44.1 % (ref 36.0–46.0)
Hemoglobin: 14.3 g/dL (ref 12.0–15.0)
Immature Granulocytes: 0 %
Lymphocytes Relative: 14 %
Lymphs Abs: 1.2 10*3/uL (ref 0.7–4.0)
MCH: 29.1 pg (ref 26.0–34.0)
MCHC: 32.4 g/dL (ref 30.0–36.0)
MCV: 89.8 fL (ref 80.0–100.0)
Monocytes Absolute: 0.4 10*3/uL (ref 0.1–1.0)
Monocytes Relative: 4 %
Neutro Abs: 6.8 10*3/uL (ref 1.7–7.7)
Neutrophils Relative %: 80 %
Platelets: 247 10*3/uL (ref 150–400)
RBC: 4.91 MIL/uL (ref 3.87–5.11)
RDW: 12.4 % (ref 11.5–15.5)
WBC: 8.5 10*3/uL (ref 4.0–10.5)
nRBC: 0 % (ref 0.0–0.2)

## 2021-08-20 LAB — ECHOCARDIOGRAM COMPLETE
Area-P 1/2: 3.48 cm2
P 1/2 time: 542 msec
S' Lateral: 2.9 cm

## 2021-08-20 LAB — TROPONIN I (HIGH SENSITIVITY): Troponin I (High Sensitivity): 3 ng/L (ref <18)
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
## 2021-08-21 ENCOUNTER — Emergency Department
Admission: EM | Admit: 2021-08-21 | Discharge: 2021-08-21 | Discharge status: Against Medical Advice | Payer: Medicaid Other | Attending: Emergency Medicine | Admitting: Emergency Medicine

## 2022-02-14 DIAGNOSIS — H6692 Otitis media, unspecified, left ear: Secondary | ICD-10-CM | POA: Insufficient documentation

## 2022-02-14 DIAGNOSIS — J069 Acute upper respiratory infection, unspecified: Secondary | ICD-10-CM | POA: Insufficient documentation

## 2022-02-15 DIAGNOSIS — J129 Viral pneumonia, unspecified: Secondary | ICD-10-CM | POA: Insufficient documentation

## 2022-06-06 DIAGNOSIS — F419 Anxiety disorder, unspecified: Secondary | ICD-10-CM | POA: Insufficient documentation

## 2022-06-06 DIAGNOSIS — F900 Attention-deficit hyperactivity disorder, predominantly inattentive type: Secondary | ICD-10-CM | POA: Insufficient documentation

## 2022-06-06 DIAGNOSIS — F319 Bipolar disorder, unspecified: Secondary | ICD-10-CM | POA: Insufficient documentation

## 2022-06-06 DIAGNOSIS — F50819 Binge eating disorder, unspecified: Secondary | ICD-10-CM | POA: Insufficient documentation

## 2022-06-20 ENCOUNTER — Other Ambulatory Visit: Payer: Self-pay

## 2022-06-20 ENCOUNTER — Encounter (HOSPITAL_BASED_OUTPATIENT_CLINIC_OR_DEPARTMENT_OTHER): Payer: Self-pay

## 2022-06-20 DIAGNOSIS — M62838 Other muscle spasm: Secondary | ICD-10-CM | POA: Insufficient documentation

## 2022-06-20 DIAGNOSIS — M542 Cervicalgia: Secondary | ICD-10-CM | POA: Diagnosis present

## 2022-06-20 LAB — HCG, SERUM, QUALITATIVE: Preg, Serum: NEGATIVE

## 2022-06-20 LAB — CBC
HCT: 41.7 % (ref 36.0–46.0)
Hemoglobin: 14 g/dL (ref 12.0–15.0)
MCH: 28.5 pg (ref 26.0–34.0)
MCHC: 33.6 g/dL (ref 30.0–36.0)
MCV: 84.8 fL (ref 80.0–100.0)
Platelets: 285 10*3/uL (ref 150–400)
RBC: 4.92 MIL/uL (ref 3.87–5.11)
RDW: 12.9 % (ref 11.5–15.5)
WBC: 6.6 10*3/uL (ref 4.0–10.5)
nRBC: 0 % (ref 0.0–0.2)

## 2022-06-20 LAB — BASIC METABOLIC PANEL
Anion gap: 10 (ref 5–15)
BUN: 12 mg/dL (ref 6–20)
CO2: 25 mmol/L (ref 22–32)
Calcium: 9.1 mg/dL (ref 8.9–10.3)
Chloride: 104 mmol/L (ref 98–111)
Creatinine, Ser: 0.94 mg/dL (ref 0.44–1.00)
GFR, Estimated: >60 mL/min (ref >60)
Glucose, Bld: 172 mg/dL — ABNORMAL HIGH (ref 70–99)
Potassium: 3.8 mmol/L (ref 3.5–5.1)
Sodium: 139 mmol/L (ref 135–145)

## 2022-06-20 NOTE — ED Triage Notes (Addendum)
Patient here POV from Home.  Endorses Left Sided neck and Shoulder Pain/Spasms that began Saturday. Worsened since and became Painful to Move. Painful from Left Neck to Elbow and Weakness in hand.   Has taken Multiple Medications such as Prednisone, NSAIDs and others without much relief.   NAD Noted during Triage. A&Ox4. GCS 15. Ambulatory.

## 2022-06-21 ENCOUNTER — Emergency Department (HOSPITAL_BASED_OUTPATIENT_CLINIC_OR_DEPARTMENT_OTHER)
Admission: EM | Admit: 2022-06-21 | Discharge: 2022-06-21 | Disposition: A | Discharge status: Home / Self Care | Payer: 59 | Attending: Emergency Medicine | Admitting: Emergency Medicine

## 2022-06-21 DIAGNOSIS — M62830 Muscle spasm of back: Secondary | ICD-10-CM

## 2022-06-21 DIAGNOSIS — M62838 Other muscle spasm: Secondary | ICD-10-CM | POA: Diagnosis not present

## 2022-06-21 MED ORDER — TIZANIDINE HCL 4 MG PO TABS: 4.0000 mg | ORAL_TABLET | Freq: Four times a day (QID) | ORAL | 0 refills | Status: AC | PRN

## 2022-06-21 MED ORDER — KETOROLAC TROMETHAMINE 30 MG/ML IJ SOLN
15.0000 mg | Freq: Once | INTRAMUSCULAR | Status: AC
  Administered 2022-06-21: 15 mg via INTRAVENOUS
  Filled 2022-06-21: qty 1

## 2022-06-21 MED ORDER — NAPROXEN 500 MG PO TABS: 500.0000 mg | ORAL_TABLET | Freq: Two times a day (BID) | ORAL | 0 refills | Status: AC

## 2022-06-21 MED ORDER — LIDOCAINE 5 % EX PTCH
1.0000 | MEDICATED_PATCH | CUTANEOUS | 0 refills | Status: AC
Start: 2022-06-21 — End: ?

## 2022-06-21 NOTE — ED Provider Notes (Signed)
[place] [facility] AT [facility]  Provider Note  CSN: [id number] Arrival date & time: 06/20/22 2116  History Chief Complaint  Patient presents with   Neck Pain    Daisy Hoffman is a 47 y.o. female reports 2 days of L neck and shoulder pain/spasm. Radiates down her arm, some tingling in fingers of the L hand. No CP or SOB. No fever. No falls or injuries. She has tried prednisone, motrin, robaxin at home with no improvement.    Home Medications Prior to Admission medications   Medication Sig Start Date End Date Taking? Authorizing Provider  lidocaine (LIDODERM) 5 % Place 1 patch onto the skin daily. Remove & Discard patch within 12 hours or as directed by MD 06/21/22  Yes Pollyann Savoy, MD  naproxen (NAPROSYN) 500 MG tablet Take 1 tablet (500 mg total) by mouth 2 (two) times daily. 06/21/22  Yes Pollyann Savoy, MD  tiZANidine (ZANAFLEX) 4 MG tablet Take 1 tablet (4 mg total) by mouth every 6 (six) hours as needed for muscle spasms. 06/21/22  Yes Pollyann Savoy, MD  buPROPion (WELLBUTRIN XL) 150 MG 24 hr tablet Take 150 mg by mouth daily.    [provider]  lamoTRIgine (LAMICTAL) 25 MG tablet Take 25 mg by mouth daily. 05/12/21   [provider]  naltrexone (DEPADE) 50 MG tablet Take 25-50 mg by mouth daily. 07/12/21   [provider]  traZODone (DESYREL) 50 MG tablet TAKE 1-1.5 TABLETS BY MOUTH AT BEDTIME    [provider]  valACYclovir (VALTREX) 500 MG tablet Take 500 mg by mouth daily as needed (for cold sores until clear). 02/06/21   [provider]  ZOVIRAX 5 % Apply 1 application topically as needed (for cold sores). 01/26/21   [provider]     Allergies    Omnicef [cefdinir] and Penicillins   Review of Systems   Review of Systems Please see HPI for pertinent positives and negatives  Physical Exam BP 119/81   Pulse 66   Temp 97.9 F (36.6 C) (Oral)   Resp 19   Ht 5\' 2"  (1.575 m)   Wt  97.5 kg   SpO2 100%   BMI 39.32 kg/m   Physical Exam Vitals and nursing note reviewed.  Constitutional:      Appearance: Normal appearance.  HENT:     Head: Normocephalic and atraumatic.     Nose: Nose normal.     Mouth/Throat:     Mouth: Mucous membranes are moist.  Eyes:     Extraocular Movements: Extraocular movements intact.     Conjunctiva/sclera: Conjunctivae normal.  Cardiovascular:     Rate and Rhythm: Normal rate.  Pulmonary:     Effort: Pulmonary effort is normal.     Breath sounds: Normal breath sounds.  Abdominal:     General: Abdomen is flat.     Palpations: Abdomen is soft.     Tenderness: There is no abdominal tenderness.  Musculoskeletal:        General: Tenderness (L trapezius, worse with ROM of L shoulder) present. No swelling. Normal range of motion.     Cervical back: Neck supple.  Skin:    General: Skin is warm and dry.  Neurological:     General: No focal deficit present.     Mental Status: She is alert.  Psychiatric:        Mood and Affect: Mood normal.     ED Results / Procedures / Treatments  EKG EKG Interpretation  Date/Time:  Monday June 20 2022 21:40:07 EDT Ventricular Rate:  84 PR Interval:  160 QRS Duration: 60 QT Interval:  346 QTC Calculation: 408 R Axis:   -15 Text Interpretation: Normal sinus rhythm Inferior infarct (cited on or before 20-Feb-2021) Anterolateral infarct (cited on or before 17-Feb-2021) Abnormal ECG When compared with ECG of 20-Aug-2021 22:22, No significant change was found Confirmed by Ernie Avena ([id number]) on 06/20/2022 10:32:36 PM  Procedures Procedures  Medications Ordered in the ED Medications  ketorolac (TORADOL) 30 MG/ML injection 15 mg (15 mg Intravenous Given 06/21/22 0131)    Initial Impression and Plan  Patient here with L shoulder pain, some radicular symptoms. Tender to palpation there. Labs done in triage show normal CBC, BMP with mild hyperglycemia, likely due to steroids. HCG is neg. Pain is  MSK in etiology. Will plan Rx for Naprosyn, tizanidine, and lidocaine patch. Recommend heat and massage. PCP follow up, RTED for any other concerns.    ED Course       MDM Rules/Calculators/A&P Medical Decision Making Problems Addressed: Spasm of left trapezius muscle: acute illness or injury  Amount and/or Complexity of Data Reviewed Labs: ordered. Decision-making details documented in ED Course. ECG/medicine tests: ordered and independent interpretation performed. Decision-making details documented in ED Course.  Risk Prescription drug management.     Final Clinical Impression(s) / ED Diagnoses Final diagnoses:  Spasm of left trapezius muscle    Rx / DC Orders ED Discharge Orders          Ordered    naproxen (NAPROSYN) 500 MG tablet  2 times daily        06/21/22 0136    tiZANidine (ZANAFLEX) 4 MG tablet  Every 6 hours PRN        06/21/22 0136    lidocaine (LIDODERM) 5 %  Every 24 hours        06/21/22 0136             Pollyann Savoy, MD 06/21/22 [phone]

## 2022-07-17 ENCOUNTER — Encounter: Payer: Self-pay | Admitting: Emergency Medicine

## 2022-07-17 ENCOUNTER — Ambulatory Visit
Admission: EM | Admit: 2022-07-17 | Discharge: 2022-07-17 | Disposition: A | Discharge status: Home / Self Care | Payer: 59 | Attending: Internal Medicine | Admitting: Internal Medicine

## 2022-07-17 DIAGNOSIS — H65192 Other acute nonsuppurative otitis media, left ear: Secondary | ICD-10-CM | POA: Diagnosis not present

## 2022-07-17 MED ORDER — AZITHROMYCIN 250 MG PO TABS: ORAL_TABLET | ORAL | 0 refills | Status: AC

## 2022-07-17 NOTE — Discharge Instructions (Signed)
I have prescribed you an antibiotic for ear infection.  Try to avoid use with trazodone.  Follow-up if any symptoms persist or worsen.

## 2022-07-17 NOTE — ED Triage Notes (Signed)
Pt said x 1 week has had left ear pain and fullness. Pt said there is a really loud ringing noise in that ear. When she plugs her right ear she can't hear anything.

## 2022-07-17 NOTE — ED Provider Notes (Signed)
[facility]    CSN: [id number] Arrival date & time: 07/17/22  1140      History   Chief Complaint Chief Complaint  Patient presents with   Otalgia    HPI Daisy Hoffman is a 47 y.o. female.   Patient presents with left ear pain that has been present for about 10 days.  Reports that she does have some nasal congestion but she was prescribed doxycycline for skin infection which helped clear up nasal congestion.  She states that she just completed doxycycline yesterday.  States that it feels like there is some fluid in her ear and she also has a ringing sensation in her ear.  Patient not reporting any fever, drainage, foreign body in the ear.   Otalgia   Past Medical History:  Diagnosis Date   Abnormal EKG    Allergy    Anemia    Anxiety    Depression    GERD (gastroesophageal reflux disease)    occ, due to gastric lapband   Headache    LGSIL (low grade squamous intraepithelial dysplasia) 11/2005, 11/2006, 02/2007, 08/2008.,02/2008   C&B LGSIL 11/2005, C&B 05/2006 PAP LGSIL A FEW HIGHER,   Restless leg syndrome    Snores     Patient Active Problem List   Diagnosis Date Noted   UTI (urinary tract infection) 02/20/2021   MDD (major depressive disorder), recurrent episode (HCC) 02/18/2021   Primary osteoarthritis of first carpometacarpal joint of left hand 09/16/2020   Chest wall pain 03/27/2019   Fatigue 07/12/2013   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 10/11/2012   Allergic rhinitis 09/10/2012   Edema 09/10/2012   Depression    Anxiety    IUD    OBESITY 04/20/2006   TOBACCO ABUSE 04/20/2006    Past Surgical History:  Procedure Laterality Date   CARPAL TUNNEL RELEASE Right 04/28/2016   Procedure: RIGHT CARPAL TUNNEL RELEASE;  Surgeon: Betha Loa, MD;  Location: [facility];  Service: Orthopedics;  Laterality: Right;   CARPAL TUNNEL RELEASE Left 06/16/2016   Procedure: LEFT CARPAL TUNNEL RELEASE;  Surgeon: Betha Loa, MD;  Location:  [facility];  Service: Orthopedics;  Laterality: Left;   FOOT SURGERY  1997   INTRAUTERINE DEVICE INSERTION  10/07/2013   Mirena   LAPAROSCOPIC GASTRIC BANDING  04/2008    OB History     Gravida  2   Para  1   Term  1   Preterm      AB  1   Living  1      SAB      IAB      Ectopic      Multiple      Live Births               Home Medications    Prior to Admission medications   Medication Sig Start Date End Date Taking? Authorizing Provider  azithromycin (ZITHROMAX Z-PAK) 250 MG tablet Take 2 tablets (500 mg total) by mouth daily for 1 day, THEN 1 tablet (250 mg total) daily for 4 days. 07/17/22 07/22/22 Yes Derrian Rodak, Acie Fredrickson, FNP  buPROPion (WELLBUTRIN XL) 150 MG 24 hr tablet Take 150 mg by mouth daily.    [provider]  lamoTRIgine (LAMICTAL) 25 MG tablet Take 25 mg by mouth daily. 05/12/21   [provider]  lidocaine (LIDODERM) 5 % Place 1 patch onto the skin daily. Remove & Discard patch within 12 hours or as directed by  MD 06/21/22   Pollyann Savoy, MD  naltrexone (DEPADE) 50 MG tablet Take 25-50 mg by mouth daily. 07/12/21   [provider]  naproxen (NAPROSYN) 500 MG tablet Take 1 tablet (500 mg total) by mouth 2 (two) times daily. 06/21/22   Pollyann Savoy, MD  tiZANidine (ZANAFLEX) 4 MG tablet Take 1 tablet (4 mg total) by mouth every 6 (six) hours as needed for muscle spasms. 06/21/22   Pollyann Savoy, MD  traZODone (DESYREL) 50 MG tablet TAKE 1-1.5 TABLETS BY MOUTH AT BEDTIME    [provider]  valACYclovir (VALTREX) 500 MG tablet Take 500 mg by mouth daily as needed (for cold sores until clear). 02/06/21   [provider]  ZOVIRAX 5 % Apply 1 application topically as needed (for cold sores). 01/26/21   [provider]    Family History Family History  Problem Relation Age of Onset   Diabetes Father    Hypertension Father    Osteoporosis Mother    Hypertension Mother     Social  History Social History   Tobacco Use   Smoking status: Former    Packs/day: 1.00    Years: 22.00    Additional pack years: 0.00    Total pack years: 22.00    Types: Cigarettes    Start date: 01/25/2012    Quit date: 07/27/2012    Years since quitting: 9.9   Smokeless tobacco: Never   Tobacco comments:    vapes with nicotene daily  Vaping Use   Vaping Use: Every day   Substances: Nicotine, CBD, Flavoring  Substance Use Topics   Alcohol use: Yes    Comment: rarely   Drug use: No     Allergies   Omnicef [cefdinir] and Penicillins   Review of Systems Review of Systems Per HPI  Physical Exam Triage Vital Signs ED Triage Vitals  Enc Vitals Group     BP 07/17/22 1243 125/87     Pulse Rate 07/17/22 1243 94     Resp 07/17/22 1243 16     Temp 07/17/22 1243 98.5 F (36.9 C)     Temp Source 07/17/22 1243 Oral     SpO2 07/17/22 1243 97 %     Weight --      Height --      Head Circumference --      Peak Flow --      Pain Score 07/17/22 1241 10     Pain Loc --      Pain Edu? --      Excl. in GC? --    No data found.  Updated Vital Signs BP 125/87 (BP Location: Left Arm)   Pulse 94   Temp 98.5 F (36.9 C) (Oral)   Resp 16   SpO2 97%   Visual Acuity Right Eye Distance:   Left Eye Distance:   Bilateral Distance:    Right Eye Near:   Left Eye Near:    Bilateral Near:     Physical Exam Constitutional:      General: She is not in acute distress.    Appearance: Normal appearance. She is not toxic-appearing.  HENT:     Head: Normocephalic and atraumatic.     Left Ear: Ear canal and external ear normal. No drainage, swelling or tenderness.  No middle ear effusion. There is no impacted cerumen. No foreign body. No mastoid tenderness. Tympanic membrane is erythematous. Tympanic membrane is not perforated or bulging.  Eyes:  Extraocular Movements: Extraocular movements intact.     Conjunctiva/sclera: Conjunctivae normal.  Pulmonary:     Effort: Pulmonary  effort is normal.  Neurological:     General: No focal deficit present.     Mental Status: She is alert and oriented to person, place, and time. Mental status is at baseline.  Psychiatric:        Mood and Affect: Mood normal.        Behavior: Behavior normal.        Thought Content: Thought content normal.        Judgment: Judgment normal.      UC Treatments / Results  Labs (all labs ordered are listed, but only abnormal results are displayed) Labs Reviewed - No data to display  EKG   Radiology No results found.  Procedures Procedures (including critical care time)  Medications Ordered in UC Medications - No data to display  Initial Impression / Assessment and Plan / UC Course  I have reviewed the triage vital signs and the nursing notes.  Pertinent labs & imaging results that were available during my care of the patient were reviewed by me and considered in my medical decision making (see chart for details).     Patient has left otitis media.  Given allergies, will treat with azithromycin.  Advised to avoid trazodone if at all possible while taking azithromycin.  Advised follow-up if any symptoms persist or worsen.  Patient verbalized understanding and was agreeable with plan. Final Clinical Impressions(s) / UC Diagnoses   Final diagnoses:  Other non-recurrent acute nonsuppurative otitis media of left ear     Discharge Instructions      I have prescribed you an antibiotic for ear infection.  Try to avoid use with trazodone.  Follow-up if any symptoms persist or worsen.    ED Prescriptions     Medication Sig Dispense Auth. Provider   azithromycin (ZITHROMAX Z-PAK) 250 MG tablet Take 2 tablets (500 mg total) by mouth daily for 1 day, THEN 1 tablet (250 mg total) daily for 4 days. 6 tablet [place], Acie Fredrickson, [place]      PDMP not reviewed this encounter.   Gustavus Bryant, [place] 07/17/22 1346

## 2022-08-15 ENCOUNTER — Other Ambulatory Visit: Payer: Self-pay | Admitting: Physician Assistant

## 2022-08-15 DIAGNOSIS — Z1231 Encounter for screening mammogram for malignant neoplasm of breast: Secondary | ICD-10-CM

## 2022-08-19 ENCOUNTER — Ambulatory Visit: Payer: 59

## 2022-08-19 ENCOUNTER — Ambulatory Visit
Admission: RE | Admit: 2022-08-19 | Discharge: 2022-08-19 | Disposition: A | Discharge status: Home / Self Care | Payer: 59 | Source: Ambulatory Visit | Attending: Physician Assistant | Admitting: Physician Assistant

## 2022-08-19 DIAGNOSIS — Z1231 Encounter for screening mammogram for malignant neoplasm of breast: Secondary | ICD-10-CM

## 2022-08-23 ENCOUNTER — Other Ambulatory Visit: Payer: Self-pay | Admitting: Physician Assistant

## 2022-08-23 DIAGNOSIS — R928 Other abnormal and inconclusive findings on diagnostic imaging of breast: Secondary | ICD-10-CM

## 2022-08-25 DIAGNOSIS — N6310 Unspecified lump in the right breast, unspecified quadrant: Secondary | ICD-10-CM | POA: Insufficient documentation

## 2022-09-05 ENCOUNTER — Ambulatory Visit
Admission: RE | Admit: 2022-09-05 | Discharge: 2022-09-05 | Disposition: A | Discharge status: Home / Self Care | Payer: 59 | Source: Ambulatory Visit | Attending: Physician Assistant | Admitting: Physician Assistant

## 2022-09-05 DIAGNOSIS — R928 Other abnormal and inconclusive findings on diagnostic imaging of breast: Secondary | ICD-10-CM

## 2022-09-05 IMAGING — CT CT RENAL STONE PROTOCOL
2 of 4 series · 15 of 46 positions shown, 17 images · non-contrast
Comparison: Comparison made with October 10, 2012.

CLINICAL DATA: A 46-year-old female presents for evaluation of
flank pain and suspected kidney stone, reportedly with inability to
urinate.



[Series 2: axial st · axial · 0.77mm/px · z∈[+1130,+1565]mm · 12 of 99 slices shown, 14 images]
[im 6/99  soft-tissue]
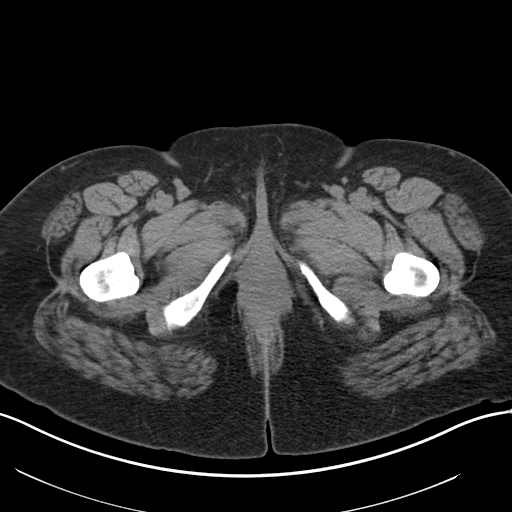
[im 6/99  bone]
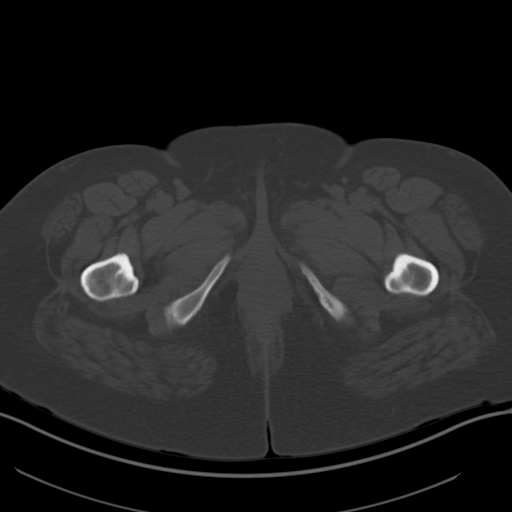
[im 18/99  soft-tissue]
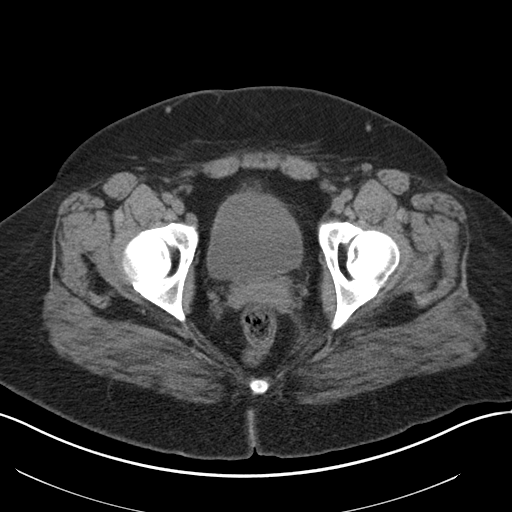
[im 24/99  soft-tissue]
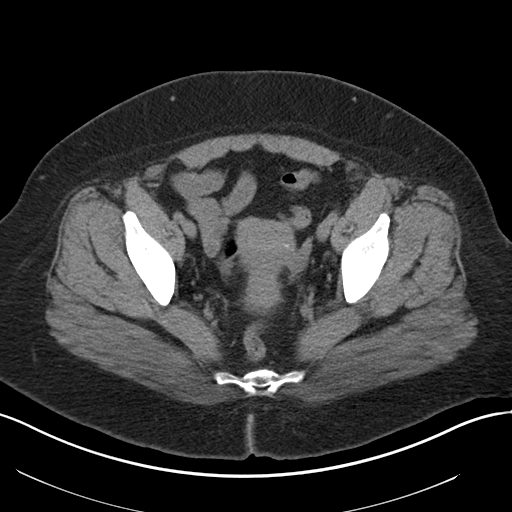
[im 29/99  soft-tissue]
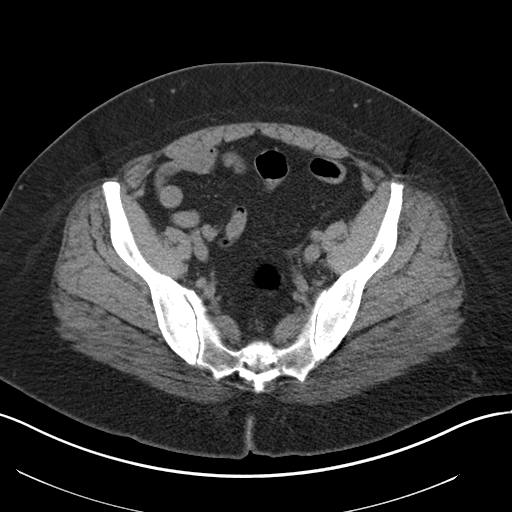
[im 41/99  soft-tissue]
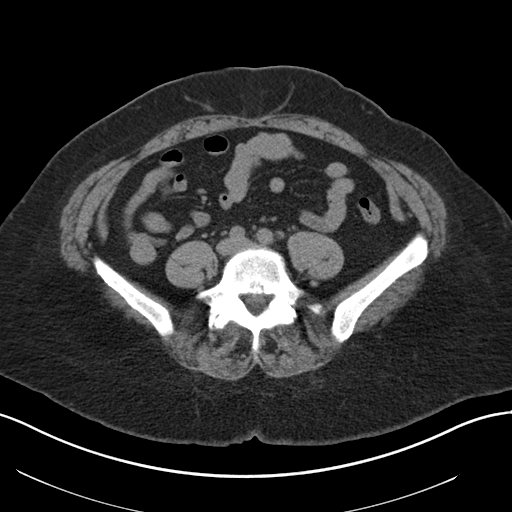
[im 47/99  soft-tissue]
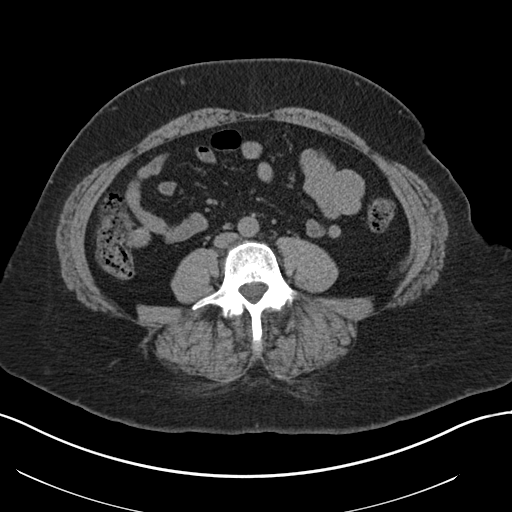
[im 52/99  soft-tissue]
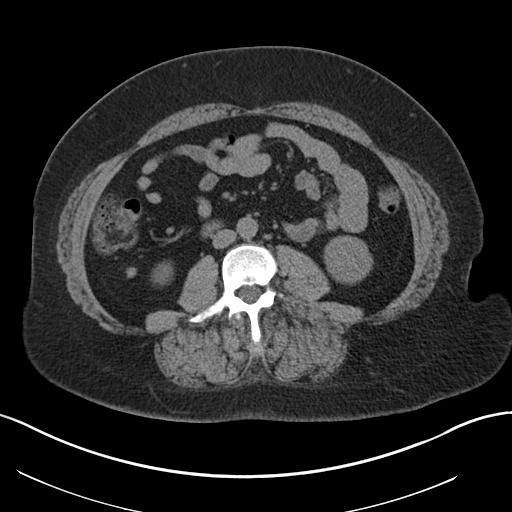
[im 64/99  soft-tissue]
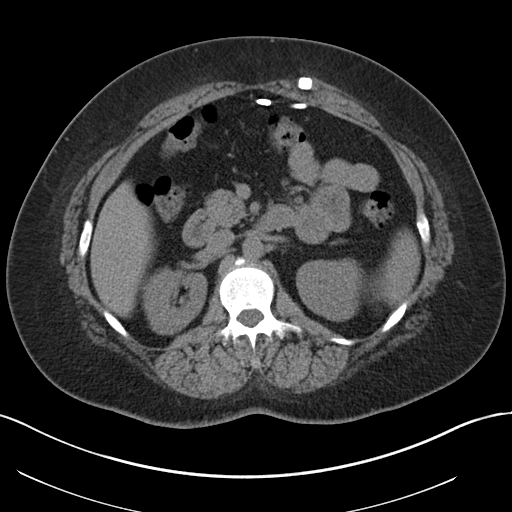
[im 70/99  soft-tissue]
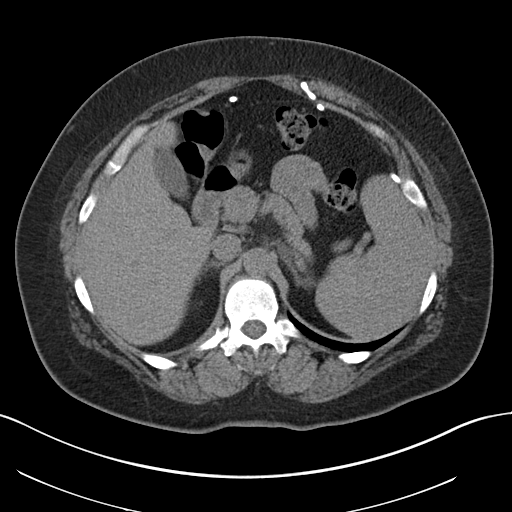
[im 70/99  bone]
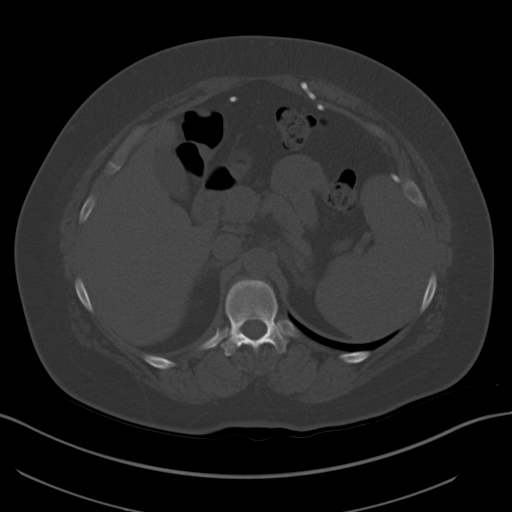
[im 75/99  soft-tissue]
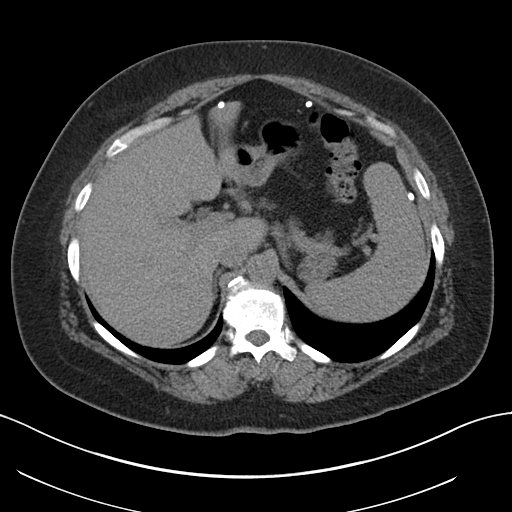
[im 87/99  soft-tissue]
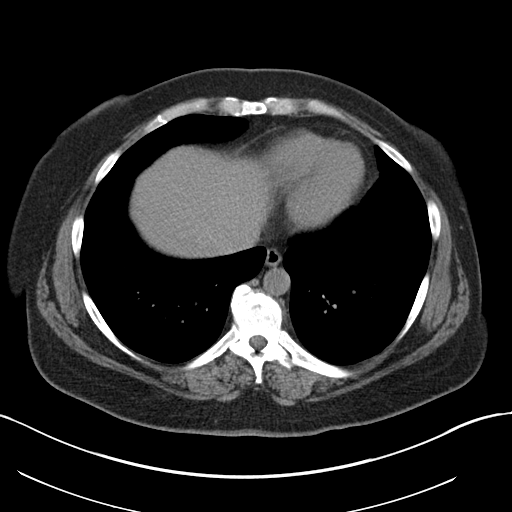
[im 93/99  soft-tissue]
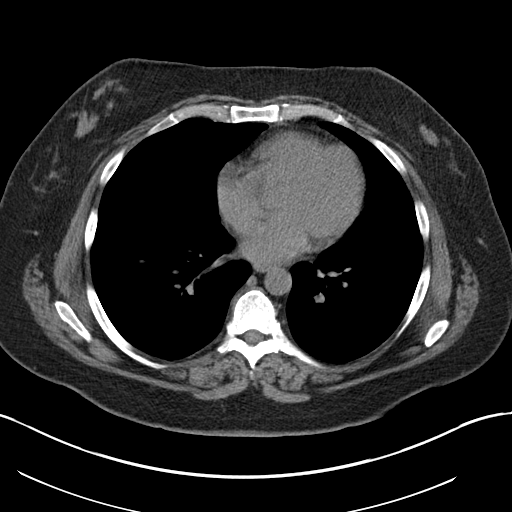

[Series 4: coronal · coronal · 0.66mm/px · 3 of 141 slices shown]
[im 47/141  soft-tissue]
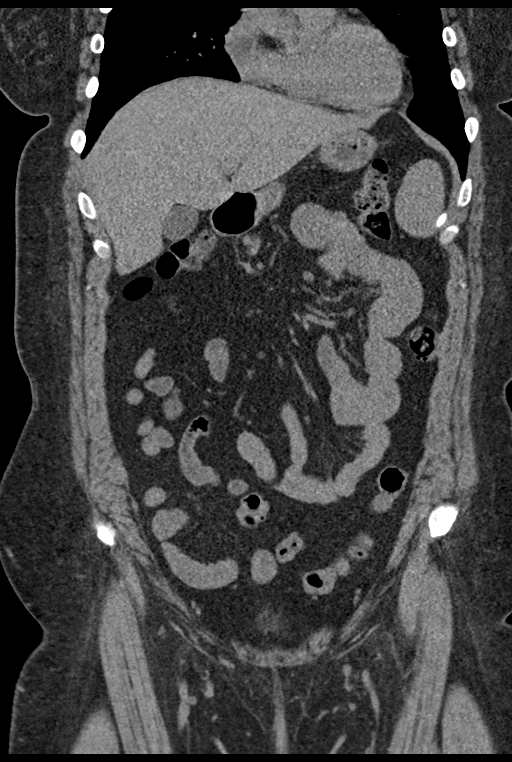
[im 63/141  soft-tissue]
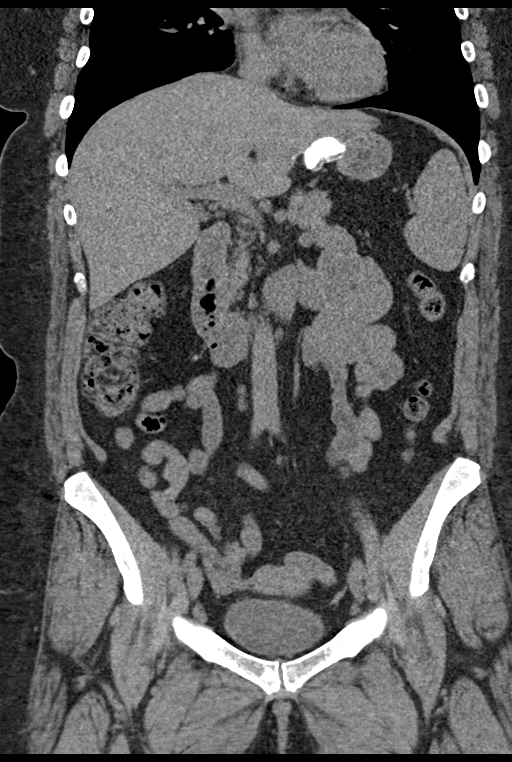
[im 78/141  soft-tissue]
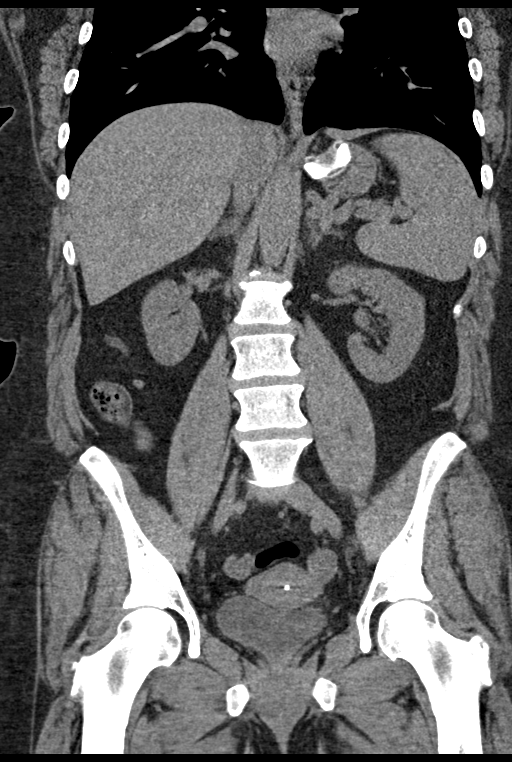

[15 of 46 positions shown; findings below may reference images not displayed]

FINDINGS: Lower chest: Lung bases are clear.  No effusion.  No consolidation.

Hepatobiliary: No focal, suspicious hepatic lesion on noncontrast
imaging. No pericholecystic stranding or gross biliary duct
distension.

Pancreas: Normal without signs of inflammation.

Spleen: Normal size and contour.

Adrenals/Urinary Tract: Adrenal glands are normal. No perinephric
stranding. No hydronephrosis. No renal or ureteric calculi. No
perivesical stranding.

Stomach/Bowel: Post gastric banding. No stranding about the
esophagogastric junction or proximal stomach. Similar appearance of
the band when compared to prior imaging. No acute gastrointestinal
process without stranding or bowel dilation. Normal appendix.

Vascular/Lymphatic:

Aorta with smooth contours. IVC with smooth contours. No aneurysmal
dilation of the abdominal aorta. There is no gastrohepatic or
hepatoduodenal ligament lymphadenopathy. No retroperitoneal or
mesenteric lymphadenopathy.

No pelvic sidewall lymphadenopathy.

Limited assessment of vascular structures without intravenous
contrast.

Reproductive: IUD in place.  No adnexal mass.

Other: No ascites. No pneumoperitoneum. No stranding along the
course of the catheter for the gastric band or about the port site
in the abdomen. Small umbilical hernia contains fat.

Musculoskeletal: No acute bone finding. No destructive bone process.
Spinal degenerative changes.
IMPRESSION: 1. No acute intra-abdominal abnormality. Specifically, no renal or
ureteric calculi.
2. Normal appendix.
3. No pericholecystic stranding.
4. Post gastric banding. Similar appearance of the band when
compared to prior imaging.

## 2023-02-01 ENCOUNTER — Ambulatory Visit: Payer: Self-pay

## 2023-07-07 ENCOUNTER — Ambulatory Visit: Admitting: Podiatry

## 2023-07-07 ENCOUNTER — Ambulatory Visit (INDEPENDENT_AMBULATORY_CARE_PROVIDER_SITE_OTHER)

## 2023-07-07 DIAGNOSIS — M7752 Other enthesopathy of left foot: Secondary | ICD-10-CM

## 2023-07-07 DIAGNOSIS — M722 Plantar fascial fibromatosis: Secondary | ICD-10-CM | POA: Diagnosis not present

## 2023-07-07 MED ORDER — TRIAMCINOLONE ACETONIDE 10 MG/ML IJ SUSP
5.0000 mg | Freq: Once | INTRAMUSCULAR | Status: AC
  Administered 2023-07-07: 5 mg via INTRAMUSCULAR

## 2023-07-07 MED ORDER — MELOXICAM 7.5 MG PO TABS: 7.5000 mg | ORAL_TABLET | Freq: Every day | ORAL | 0 refills | Status: AC | PRN

## 2023-07-07 NOTE — Patient Instructions (Addendum)
If was nice to meet you today. If you have any questions or any further concerns, please feel fee to give me a call. You can call our office at [phone] or please feel fee to send me a message through MyChart.   --  For instructions on how to put on your Plantar Fascial Brace, please visit [email or web address]   -  While at your visit today you received a steroid injection in your foot or ankle to help with your pain. Along with having the steroid medication there is some "numbing" medication in the shot that you received. Due to this you may notice some numbness to the area for the next couple of hours.   I would recommend limiting activity for the next few days to help the steroid injection take affect.    The actually benefit from the steroid injection may take up to 2-7 days to see a difference. You may actually experience a small (as in 10%) INCREASE in pain in the first 24 hours---that is common. It would be best if you can ice the area today and take anti-inflammatory medications (such as Ibuprofen, Motrin, or Aleve) if you are able to take these medications. If you were prescribed another medication to help with the pain go ahead and start that medication today    Things to watch out for that you should contact us or a health care provider urgently would include: 1. Unusual (as in more than 10%) increase in pain 2. New fever > 101.5 3. New swelling or redness of the injected area.  4. Streaking of red lines around the area injected.  If you have any questions or concerns about this, please give our office a call at [phone].    --  Plantar Fasciitis (Heel Spur Syndrome) with Rehab The plantar fascia is a fibrous, ligament-like, soft-tissue structure that spans the bottom of the foot. Plantar fasciitis is a condition that causes pain in the foot due to inflammation of the tissue. SYMPTOMS  Pain and tenderness on the underneath side of the foot. Pain that worsens with  standing or walking. CAUSES  Plantar fasciitis is caused by irritation and injury to the plantar fascia on the underneath side of the foot. Common mechanisms of injury include: Direct trauma to bottom of the foot. Damage to a small nerve that runs under the foot where the main fascia attaches to the heel bone. Stress placed on the plantar fascia due to bone spurs. RISK INCREASES WITH:  Activities that place stress on the plantar fascia (running, jumping, pivoting, or cutting). Poor strength and flexibility. Improperly fitted shoes. Tight calf muscles. Flat feet. Failure to warm-up properly before activity. Obesity. PREVENTION Warm up and stretch properly before activity. Allow for adequate recovery between workouts. Maintain physical fitness: Strength, flexibility, and endurance. Cardiovascular fitness. Maintain a health body weight. Avoid stress on the plantar fascia. Wear properly fitted shoes, including arch supports for individuals who have flat feet.  PROGNOSIS  If treated properly, then the symptoms of plantar fasciitis usually resolve without surgery. However, occasionally surgery is necessary.  RELATED COMPLICATIONS  Recurrent symptoms that may result in a chronic condition. Problems of the lower back that are caused by compensating for the injury, such as limping. Pain or weakness of the foot during push-off following surgery. Chronic inflammation, scarring, and partial or complete fascia tear, occurring more often from repeated injections.  TREATMENT  Treatment initially involves the use of ice and medication to help reduce pain and inflammation. The  use of strengthening and stretching exercises may help reduce pain with activity, especially stretches of the Achilles tendon. These exercises may be performed at home or with a therapist. Your caregiver may recommend that you use heel cups of arch supports to help reduce stress on the plantar fascia. Occasionally,  corticosteroid injections are given to reduce inflammation. If symptoms persist for greater than 6 months despite non-surgical (conservative), then surgery may be recommended.   MEDICATION  If pain medication is necessary, then nonsteroidal anti-inflammatory medications, such as aspirin and ibuprofen, or other minor pain relievers, such as acetaminophen, are often recommended. Do not take pain medication within 7 days before surgery. Prescription pain relievers may be given if deemed necessary by your caregiver. Use only as directed and only as much as you need. Corticosteroid injections may be given by your caregiver. These injections should be reserved for the most serious cases, because they may only be given a certain number of times.  HEAT AND COLD Cold treatment (icing) relieves pain and reduces inflammation. Cold treatment should be applied for 10 to 15 minutes every 2 to 3 hours for inflammation and pain and immediately after any activity that aggravates your symptoms. Use ice packs or massage the area with a piece of ice (ice massage). Heat treatment may be used prior to performing the stretching and strengthening activities prescribed by your caregiver, physical therapist, or athletic trainer. Use a heat pack or soak the injury in warm water.  SEEK IMMEDIATE MEDICAL CARE IF: Treatment seems to offer no benefit, or the condition worsens. Any medications produce adverse side effects.  EXERCISES- RANGE OF MOTION (ROM) AND STRETCHING EXERCISES - Plantar Fasciitis (Heel Spur Syndrome) These exercises may help you when beginning to rehabilitate your injury. Your symptoms may resolve with or without further involvement from your physician, physical therapist or athletic trainer. While completing these exercises, remember:  Restoring tissue flexibility helps normal motion to return to the joints. This allows healthier, less painful movement and activity. An effective stretch should be held for  at least 30 seconds. A stretch should never be painful. You should only feel a gentle lengthening or release in the stretched tissue.  RANGE OF MOTION - Toe Extension, Flexion Sit with your right / left leg crossed over your opposite knee. Grasp your toes and gently pull them back toward the top of your foot. You should feel a stretch on the bottom of your toes and/or foot. Hold this stretch for 10 seconds. Now, gently pull your toes toward the bottom of your foot. You should feel a stretch on the top of your toes and or foot. Hold this stretch for 10 seconds. Repeat  times. Complete this stretch 3 times per day.   RANGE OF MOTION - Ankle Dorsiflexion, Active Assisted Remove shoes and sit on a chair that is preferably not on a carpeted surface. Place right / left foot under knee. Extend your opposite leg for support. Keeping your heel down, slide your right / left foot back toward the chair until you feel a stretch at your ankle or calf. If you do not feel a stretch, slide your bottom forward to the edge of the chair, while still keeping your heel down. Hold this stretch for 10 seconds. Repeat 3 times. Complete this stretch 2 times per day.   STRETCH  Gastroc, Standing Place hands on wall. Extend right / left leg, keeping the front knee somewhat bent. Slightly point your toes inward on your back foot. Keeping your  right / left heel on the floor and your knee straight, shift your weight toward the wall, not allowing your back to arch. You should feel a gentle stretch in the right / left calf. Hold this position for 10 seconds. Repeat 3 times. Complete this stretch 2 times per day.  STRETCH  Soleus, Standing Place hands on wall. Extend right / left leg, keeping the other knee somewhat bent. Slightly point your toes inward on your back foot. Keep your right / left heel on the floor, bend your back knee, and slightly shift your weight over the back leg so that you feel a gentle stretch deep  in your back calf. Hold this position for 10 seconds. Repeat 3 times. Complete this stretch 2 times per day.  STRETCH  Gastrocsoleus, Standing  Note: This exercise can place a lot of stress on your foot and ankle. Please complete this exercise only if specifically instructed by your caregiver.  Place the ball of your right / left foot on a step, keeping your other foot firmly on the same step. Hold on to the wall or a rail for balance. Slowly lift your other foot, allowing your body weight to press your heel down over the edge of the step. You should feel a stretch in your right / left calf. Hold this position for 10 seconds. Repeat this exercise with a slight bend in your right / left knee. Repeat 3 times. Complete this stretch 2 times per day.   STRENGTHENING EXERCISES - Plantar Fasciitis (Heel Spur Syndrome)  These exercises may help you when beginning to rehabilitate your injury. They may resolve your symptoms with or without further involvement from your physician, physical therapist or athletic trainer. While completing these exercises, remember:  Muscles can gain both the endurance and the strength needed for everyday activities through controlled exercises. Complete these exercises as instructed by your physician, physical therapist or athletic trainer. Progress the resistance and repetitions only as guided.  STRENGTH - Towel Curls Sit in a chair positioned on a non-carpeted surface. Place your foot on a towel, keeping your heel on the floor. Pull the towel toward your heel by only curling your toes. Keep your heel on the floor. Repeat 3 times. Complete this exercise 2 times per day.  STRENGTH - Ankle Inversion Secure one end of a rubber exercise band/tubing to a fixed object (table, pole). Loop the other end around your foot just before your toes. Place your fists between your knees. This will focus your strengthening at your ankle. Slowly, pull your big toe up and in, making  sure the band/tubing is positioned to resist the entire motion. Hold this position for 10 seconds. Have your muscles resist the band/tubing as it slowly pulls your foot back to the starting position. Repeat 3 times. Complete this exercises 2 times per day.  Document Released: 01/03/2005 Document Revised: 03/28/2011 Document Reviewed: 04/17/2008 [facility] Patient Information 2014 [place], [place].

## 2023-07-08 NOTE — Progress Notes (Signed)
 Subjective:  Patient ID: Daisy Hoffman, female    DOB: 02/01/1975,  MRN: [id number]  Chief Complaint  Patient presents with   Foot Pain    RM#13 Left heel pain worsening over the past three months have exercises and started wearing inserts no relief.    Discussed the use of AI scribe software for clinical note transcription with the patient, who gave verbal consent to proceed.  History of Present Illness Daisy Hoffman is a 48 year old female who presents with chronic left heel pain.  She has experienced left heel pain for one year, with significant worsening over the past three months. The pain is described as throbbing and almost unbearable, worse at night, and persists upon waking. It intensifies with weight-bearing activities and is present even when sitting.  Her medical history includes plantar wart removal in 1994, which led to a cyst excision, and two toe fractures, the second resulting in a crooked toe around 2013. She has tried over-the-counter insoles and ligament exercises without relief, and the insoles have worsened pain in the ball of her foot.  She wears compression socks for swelling and uses ice packs at work. 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     Objective:    Physical Exam General: AAO x3, NAD  Dermatological: Skin is warm, dry and supple bilateral. There are no open sores, no preulcerative lesions, no rash or signs of infection present.  Vascular: Dorsalis Pedis artery and Posterior Tibial artery pedal pulses are 2/4 bilateral with immedate capillary fill time. There is no pain with calf compression, swelling, warmth, erythema.   Neruologic: Grossly intact via light touch bilateral.  Negative Tinel's sign.  Musculoskeletal: There is tenderness to palpation along the plantar medial tubercle of the calcaneus at the insertion of the plantar fascia.  She also gets some discomfort along the flexor tendons.  There is no pain on  the Achilles tendon.  There is no pain with lateral compression of the calcaneus.  Unable to appreciate any area pinpoint tenderness.  She does get some discomfort at the dorsal aspect of the midfoot but again no pinpoint tenderness noted.  There is no edema.  No ecchymosis.  MMT 5/5.  Gait: Unassisted, Nonantalgic.         Results  RADIOLOGY Foot X-ray: Heel spur present without any evidence of acute fracture.  The AP view ossicle noted lateral to the calcaneus.   Assessment:   1. Plantar fasciitis   2. Tendonitis of ankle, left      Plan:  Patient was evaluated and treated and all questions answered.  Assessment and Plan Assessment & Plan Plantar fasciitis with heel spur Chronic plantar fasciitis with heel spur, primarily affecting the left heel. Symptoms exacerbated by prolonged standing and weight-bearing activities. Imaging confirms heel spur. - Administer steroid injection to the heel today.  See procedure note below. - Prescribe meloxicam  once daily for inflammation. - Advise regular icing of the heel using a frozen water bottle or ice packs. - Provide a brace to wear over socks inside shoes for support.  Plantar fascia brace dispensed help support and stabilize the plantar fascia. - Recommend stretching exercises with a provided worksheet. - Consider custom orthotic inserts for better arch support.  Procedure: Injection Tendon/Ligament Discussed alternatives, risks, complications and verbal consent was obtained.  Location: Left plantar fascia at the glabrous junction; medial approach. Skin Prep: Alcohol. Injectate: 0.5cc 0.5% marcaine  plain, 0.5 cc 2% lidocaine  plain and, 1 cc  kenalog  10. Disposition: Patient tolerated procedure well. Injection site dressed with a band-aid.  Post-injection care was discussed and return precautions discussed.    Tendinitis of the ankle Tendinitis affecting the tendons on the lateral aspect of the left ankle. Examination indicates  inflammation without acute injury or fracture. - Continue with meloxicam  for inflammation. - Advise regular icing of the affected area. - Discussed shoes, good arch support - If symptoms persist consider advanced imaging  Return in about 2 months (around 09/06/2023), or if symptoms worsen or fail to improve.   Donnice JONELLE Fees DPM

## 2023-09-08 ENCOUNTER — Encounter: Payer: Self-pay | Admitting: Podiatry

## 2023-09-08 ENCOUNTER — Ambulatory Visit: Admitting: Podiatry

## 2023-09-08 DIAGNOSIS — M722 Plantar fascial fibromatosis: Secondary | ICD-10-CM

## 2023-09-08 MED ORDER — TRIAMCINOLONE ACETONIDE 10 MG/ML IJ SUSP
5.0000 mg | Freq: Once | INTRAMUSCULAR | Status: AC
  Administered 2023-09-08: 5 mg via INTRAMUSCULAR

## 2023-09-08 NOTE — Patient Instructions (Signed)
 For instructions on how to put on your Night Splint, please visit [email or web address]   Plantar Fasciitis (Heel Spur Syndrome) with Rehab The plantar fascia is a fibrous, ligament-like, soft-tissue structure that spans the bottom of the foot. Plantar fasciitis is a condition that causes pain in the foot due to inflammation of the tissue. SYMPTOMS   Pain and tenderness on the underneath side of the foot.  Pain that worsens with standing or walking. CAUSES  Plantar fasciitis is caused by irritation and injury to the plantar fascia on the underneath side of the foot. Common mechanisms of injury include:  Direct trauma to bottom of the foot.  Damage to a small nerve that runs under the foot where the main fascia attaches to the heel bone.  Stress placed on the plantar fascia due to bone spurs. RISK INCREASES WITH:   Activities that place stress on the plantar fascia (running, jumping, pivoting, or cutting).  Poor strength and flexibility.  Improperly fitted shoes.  Tight calf muscles.  Flat feet.  Failure to warm-up properly before activity.  Obesity. PREVENTION  Warm up and stretch properly before activity.  Allow for adequate recovery between workouts.  Maintain physical fitness:  Strength, flexibility, and endurance.  Cardiovascular fitness.  Maintain a health body weight.  Avoid stress on the plantar fascia.  Wear properly fitted shoes, including arch supports for individuals who have flat feet.  PROGNOSIS  If treated properly, then the symptoms of plantar fasciitis usually resolve without surgery. However, occasionally surgery is necessary.  RELATED COMPLICATIONS   Recurrent symptoms that may result in a chronic condition.  Problems of the lower back that are caused by compensating for the injury, such as limping.  Pain or weakness of the foot during push-off following surgery.  Chronic inflammation, scarring, and partial or complete fascia tear,  occurring more often from repeated injections.  TREATMENT  Treatment initially involves the use of ice and medication to help reduce pain and inflammation. The use of strengthening and stretching exercises may help reduce pain with activity, especially stretches of the Achilles tendon. These exercises may be performed at home or with a therapist. Your caregiver may recommend that you use heel cups of arch supports to help reduce stress on the plantar fascia. Occasionally, corticosteroid injections are given to reduce inflammation. If symptoms persist for greater than 6 months despite non-surgical (conservative), then surgery may be recommended.   MEDICATION   If pain medication is necessary, then nonsteroidal anti-inflammatory medications, such as aspirin and ibuprofen, or other minor pain relievers, such as acetaminophen, are often recommended.  Do not take pain medication within 7 days before surgery.  Prescription pain relievers may be given if deemed necessary by your caregiver. Use only as directed and only as much as you need.  Corticosteroid injections may be given by your caregiver. These injections should be reserved for the most serious cases, because they may only be given a certain number of times.  HEAT AND COLD  Cold treatment (icing) relieves pain and reduces inflammation. Cold treatment should be applied for 10 to 15 minutes every 2 to 3 hours for inflammation and pain and immediately after any activity that aggravates your symptoms. Use ice packs or massage the area with a piece of ice (ice massage).  Heat treatment may be used prior to performing the stretching and strengthening activities prescribed by your caregiver, physical therapist, or athletic trainer. Use a heat pack or soak the injury in warm water.  SEEK IMMEDIATE MEDICAL CARE  IF:  Treatment seems to offer no benefit, or the condition worsens.  Any medications produce adverse side effects.  EXERCISES- RANGE OF  MOTION (ROM) AND STRETCHING EXERCISES - Plantar Fasciitis (Heel Spur Syndrome) These exercises may help you when beginning to rehabilitate your injury. Your symptoms may resolve with or without further involvement from your physician, physical therapist or athletic trainer. While completing these exercises, remember:   Restoring tissue flexibility helps normal motion to return to the joints. This allows healthier, less painful movement and activity.  An effective stretch should be held for at least 30 seconds.  A stretch should never be painful. You should only feel a gentle lengthening or release in the stretched tissue.  RANGE OF MOTION - Toe Extension, Flexion  Sit with your right / left leg crossed over your opposite knee.  Grasp your toes and gently pull them back toward the top of your foot. You should feel a stretch on the bottom of your toes and/or foot.  Hold this stretch for 10 seconds.  Now, gently pull your toes toward the bottom of your foot. You should feel a stretch on the top of your toes and or foot.  Hold this stretch for 10 seconds. Repeat  times. Complete this stretch 3 times per day.   RANGE OF MOTION - Ankle Dorsiflexion, Active Assisted  Remove shoes and sit on a chair that is preferably not on a carpeted surface.  Place right / left foot under knee. Extend your opposite leg for support.  Keeping your heel down, slide your right / left foot back toward the chair until you feel a stretch at your ankle or calf. If you do not feel a stretch, slide your bottom forward to the edge of the chair, while still keeping your heel down.  Hold this stretch for 10 seconds. Repeat 3 times. Complete this stretch 2 times per day.   STRETCH  Gastroc, Standing  Place hands on wall.  Extend right / left leg, keeping the front knee somewhat bent.  Slightly point your toes inward on your back foot.  Keeping your right / left heel on the floor and your knee straight, shift  your weight toward the wall, not allowing your back to arch.  You should feel a gentle stretch in the right / left calf. Hold this position for 10 seconds. Repeat 3 times. Complete this stretch 2 times per day.  STRETCH  Soleus, Standing  Place hands on wall.  Extend right / left leg, keeping the other knee somewhat bent.  Slightly point your toes inward on your back foot.  Keep your right / left heel on the floor, bend your back knee, and slightly shift your weight over the back leg so that you feel a gentle stretch deep in your back calf.  Hold this position for 10 seconds. Repeat 3 times. Complete this stretch 2 times per day.  STRETCH  Gastrocsoleus, Standing  Note: This exercise can place a lot of stress on your foot and ankle. Please complete this exercise only if specifically instructed by your caregiver.   Place the ball of your right / left foot on a step, keeping your other foot firmly on the same step.  Hold on to the wall or a rail for balance.  Slowly lift your other foot, allowing your body weight to press your heel down over the edge of the step.  You should feel a stretch in your right / left calf.  Hold this position  for 10 seconds.  Repeat this exercise with a slight bend in your right / left knee. Repeat 3 times. Complete this stretch 2 times per day.   STRENGTHENING EXERCISES - Plantar Fasciitis (Heel Spur Syndrome)  These exercises may help you when beginning to rehabilitate your injury. They may resolve your symptoms with or without further involvement from your physician, physical therapist or athletic trainer. While completing these exercises, remember:   Muscles can gain both the endurance and the strength needed for everyday activities through controlled exercises.  Complete these exercises as instructed by your physician, physical therapist or athletic trainer. Progress the resistance and repetitions only as guided.  STRENGTH - Towel Curls  Sit in  a chair positioned on a non-carpeted surface.  Place your foot on a towel, keeping your heel on the floor.  Pull the towel toward your heel by only curling your toes. Keep your heel on the floor. Repeat 3 times. Complete this exercise 2 times per day.  STRENGTH - Ankle Inversion  Secure one end of a rubber exercise band/tubing to a fixed object (table, pole). Loop the other end around your foot just before your toes.  Place your fists between your knees. This will focus your strengthening at your ankle.  Slowly, pull your big toe up and in, making sure the band/tubing is positioned to resist the entire motion.  Hold this position for 10 seconds.  Have your muscles resist the band/tubing as it slowly pulls your foot back to the starting position. Repeat 3 times. Complete this exercises 2 times per day.  Document Released: 01/03/2005 Document Revised: 03/28/2011 Document Reviewed: 04/17/2008 [facility] Patient Information 2014 [place], [place].

## 2023-09-08 NOTE — Progress Notes (Signed)
  Subjective:  Patient ID: Daisy Hoffman, female    DOB: [birth year],  MRN: [id number] Chief Complaint  Patient presents with   Plantar Fasciitis    F/U Plantar fasciitis injection x 1. 60% better. 4 pain at the present. Wearing the brace and insoles, rolling frozen water bottle, stretching and massages. Non diabetic.     History of Present Illness Daisy Hoffman is a 48 year old female who presents with chronic left heel pain.  She said last injection did help.  She is still doing the stretching, icing exercises as well as wearing shoes and good arch support.  She feels the pain the injection without help but is moved back little bit more on the heel. She still gets pain in the morning.     Objective:    Physical Exam General: AAO x3, NAD  Dermatological: Skin is warm, dry and supple bilateral. There are no open sores, no preulcerative lesions, no rash or signs of infection present.  Vascular: Dorsalis Pedis artery and Posterior Tibial artery pedal pulses are 2/4 bilateral with immedate capillary fill time. There is no pain with calf compression, swelling, warmth, erythema.   Neruologic: Grossly intact via light touch bilateral.  Negative Tinel's sign.  Musculoskeletal: There is tenderness to palpation along the plantar medial tubercle of the calcaneus at the insertion of the plantar fascia.  Appears to be a little bit more proximal compared to what it was last appointment as well today.  Mild discomfort on the Achilles tendon distally.  Flexor, extensor tendons intact.  Achilles tendon intact.  No pain with lateral compression of the calcaneus.  MMT 5/5.  Gait: Unassisted, Nonantalgic.   Assessment:   1. Plantar fasciitis   2. Tendonitis of ankle, left      Plan:  Patient was evaluated and treated and all questions answered.  Assessment and Plan Assessment & Plan Plantar fasciitis with heel spur - Administer second steroid injection to the heel today.  See procedure note  below. - Meloxicam  as needed - Advise regular icing of the heel using a frozen water bottle or ice packs. - Continue plantar fascia brace -Splint dispensed given morning discomfort. Static or dynamic ankle foot orthosis, including soft interface material, adjustable for fit, for positioning, may be used for minimal ambulation, prefabricated, off-the-shelf was dispensed on the billed date of service  - Recommend stretching exercises with a provided worksheet. - Consider custom orthotic inserts for better arch support.  Procedure: Injection Tendon/Ligament Discussed alternatives, risks, complications and verbal consent was obtained.  Location: Left plantar fascia at the glabrous junction; medial approach. Skin Prep: Alcohol. Injectate: 0.5cc 0.5% marcaine  plain, 0.5 cc 2% lidocaine  plain and, 1 cc kenalog  10. Disposition: Patient tolerated procedure well. Injection site dressed with a band-aid.  Post-injection care was discussed and return precautions discussed.   Donnice JONELLE Fees DPM

## 2023-10-20 ENCOUNTER — Ambulatory Visit: Admitting: Podiatry

## 2023-10-27 ENCOUNTER — Ambulatory Visit: Admitting: Podiatry
